# Patient Record
Sex: Male | Born: 1995 | Hispanic: Yes | Marital: Single | Smoking: Never smoker
Health system: Southern US, Community
[De-identification: ages and names within clinical notes are randomized; demographics above are authoritative.]

## PROBLEM LIST (undated history)

## (undated) DIAGNOSIS — J45909 Unspecified asthma, uncomplicated: Secondary | ICD-10-CM

---
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
## 2016-01-31 ENCOUNTER — Emergency Department
Admission: EM | Admit: 2016-01-31 | Discharge: 2016-01-31 | Disposition: A | Discharge status: Home / Self Care | Payer: Medicaid Other | Attending: Emergency Medicine | Admitting: Emergency Medicine

## 2016-01-31 ENCOUNTER — Encounter: Payer: Self-pay | Admitting: *Deleted

## 2016-01-31 DIAGNOSIS — J45909 Unspecified asthma, uncomplicated: Secondary | ICD-10-CM | POA: Diagnosis not present

## 2016-01-31 DIAGNOSIS — L0291 Cutaneous abscess, unspecified: Secondary | ICD-10-CM

## 2016-01-31 DIAGNOSIS — L02416 Cutaneous abscess of left lower limb: Secondary | ICD-10-CM | POA: Diagnosis not present

## 2016-01-31 MED ORDER — IBUPROFEN 600 MG PO TABS
600.0000 mg | ORAL_TABLET | Freq: Once | ORAL | Status: AC
  Administered 2016-01-31: 600 mg via ORAL
  Filled 2016-01-31: qty 1

## 2016-01-31 MED ORDER — TRAMADOL HCL 50 MG PO TABS: 50.0000 mg | ORAL_TABLET | Freq: Four times a day (QID) | ORAL | 0 refills | Status: AC | PRN

## 2016-01-31 MED ORDER — TRAMADOL HCL 50 MG PO TABS
50.0000 mg | ORAL_TABLET | Freq: Once | ORAL | Status: AC
  Administered 2016-01-31: 50 mg via ORAL
  Filled 2016-01-31: qty 1

## 2016-01-31 MED ORDER — IBUPROFEN 600 MG PO TABS: 600.0000 mg | ORAL_TABLET | Freq: Three times a day (TID) | ORAL | 0 refills | Status: DC | PRN

## 2016-01-31 NOTE — ED Triage Notes (Signed)
Pt reports having an abscess on left upper thigh, pt reports abscess drained on its own, pt is here to get area checked, pt denies any other symptoms

## 2016-01-31 NOTE — ED Provider Notes (Signed)
[facility] Emergency Department Provider Note   ____________________________________________   None    (approximate)  I have reviewed the triage vital signs and the nursing notes.   HISTORY  Chief Complaint Abscess    HPI Edward Rosario is a 20 y.o. male patient complaining of pain secondary to an abscess to the medial left thigh. Patient state he was seen by urgent care clinic yesterday given antibiotics. Patient state that coincide not to I&D since it had moderate drainage. Patient rates his pain as a 4/10. No other palliative measures for this complaint.   Past Medical History:  Diagnosis Date  . Asthma     There are no active problems to display for this patient.   History reviewed. No pertinent surgical history.  Prior to Admission medications   Medication Sig Start Date End Date Taking? Authorizing Provider  ibuprofen (ADVIL,MOTRIN) 600 MG tablet Take 1 tablet (600 mg total) by mouth every 8 (eight) hours as needed. 01/31/16   Joni Reiningonald K Alailah Safley, PA-C  traMADol (ULTRAM) 50 MG tablet Take 1 tablet (50 mg total) by mouth every 6 (six) hours as needed. 01/31/16 01/30/17  Joni Reiningonald K Kristene Liberati, PA-C    Allergies Fish allergy  No family history on file.  Social History Social History  Substance Use Topics  . Smoking status: Never Smoker  . Smokeless tobacco: Never Used  . Alcohol use No    Review of Systems Constitutional: No fever/chills Eyes: No visual changes. ENT: No sore throat. Cardiovascular: Denies chest pain. Respiratory: Denies shortness of breath. Gastrointestinal: No abdominal pain.  No nausea, no vomiting.  No diarrhea.  No constipation. Genitourinary: Negative for dysuria. Musculoskeletal: Negative for back pain. Skin: Negative for rash. abscess left thigh Neurological: Negative for headaches, focal weakness or numbness.    ____________________________________________   PHYSICAL EXAM:  VITAL SIGNS: ED Triage Vitals    Enc Vitals Group     BP 01/31/16 0837 126/78     Pulse Rate 01/31/16 0837 60     Resp 01/31/16 0837 18     Temp 01/31/16 0837 98.6 F (37 C)     Temp Source 01/31/16 0837 Oral     SpO2 01/31/16 0837 99 %     Weight 01/31/16 0831 130 lb (59 kg)     Height 01/31/16 0831 5\' 7"  (1.702 m)     Head Circumference --      Peak Flow --      Pain Score 01/31/16 0832 4     Pain Loc --      Pain Edu? --      Excl. in GC? --     Constitutional: Alert and oriented. Well appearing and in no acute distress. Eyes: Conjunctivae are normal. PERRL. EOMI. Head: Atraumatic. Nose: No congestion/rhinnorhea. Mouth/Throat: Mucous membranes are moist.  Oropharynx non-erythematous. Neck: No stridor.  No cervical spine tenderness to palpation. Hematological/Lymphatic/Immunilogical: No cervical lymphadenopathy. Cardiovascular: Normal rate, regular rhythm. Grossly normal heart sounds.  Good peripheral circulation. Respiratory: Normal respiratory effort.  No retractions. Lungs CTAB. Gastrointestinal: Soft and nontender. No distention. No abdominal bruits. No CVA tenderness. Musculoskeletal: No lower extremity tenderness nor edema.  No joint effusions. Neurologic:  Normal speech and language. No gross focal neurologic deficits are appreciated. No gait instability. Skin:  Skin is warm, dry and intact. No rash noted.Erythematous nodule lesion medial left thigh with copious amount of purulent drainage. Psychiatric: Mood and affect are normal. Speech and behavior are normal.  ____________________________________________   LABS (all  labs ordered are listed, but only abnormal results are displayed)  Labs Reviewed - No data to display ____________________________________________  EKG   ____________________________________________  RADIOLOGY   ____________________________________________   PROCEDURES  Procedure(s) performed: None  Procedures  Critical Care performed:  No  ____________________________________________   INITIAL IMPRESSION / ASSESSMENT AND PLAN / ED COURSE  Pertinent labs & imaging results that were available during my care of the patient were reviewed by me and considered in my medical decision making (see chart for details).  Abscess left medial thigh. Patient advised to continue antibiotics. Patient given a prescription for tramadol and ibuprofen.  Clinical Course     ____________________________________________   FINAL CLINICAL IMPRESSION(S) / ED DIAGNOSES  Final diagnoses:  Abscess   Bandage was removed and lesion irrigated. Patient was replenished.   NEW MEDICATIONS STARTED DURING THIS VISIT:  New Prescriptions   IBUPROFEN (ADVIL,MOTRIN) 600 MG TABLET    Take 1 tablet (600 mg total) by mouth every 8 (eight) hours as needed.   TRAMADOL (ULTRAM) 50 MG TABLET    Take 1 tablet (50 mg total) by mouth every 6 (six) hours as needed.     Note:  This document was prepared using Dragon voice recognition software and may include unintentional dictation errors.    Joni Reining, PA-C 01/31/16 1610    Loleta Rose, MD 01/31/16 [phone]

## 2016-01-31 NOTE — ED Notes (Signed)
See triage note  States possible area to left groin  Having increased pain  But area has drained on it's own

## 2016-08-31 ENCOUNTER — Emergency Department
Admission: EM | Admit: 2016-08-31 | Discharge: 2016-08-31 | Disposition: A | Discharge status: Home / Self Care | Payer: Medicaid Other | Attending: Emergency Medicine | Admitting: Emergency Medicine

## 2016-08-31 DIAGNOSIS — J45909 Unspecified asthma, uncomplicated: Secondary | ICD-10-CM | POA: Diagnosis not present

## 2016-08-31 DIAGNOSIS — L0231 Cutaneous abscess of buttock: Secondary | ICD-10-CM | POA: Insufficient documentation

## 2016-08-31 NOTE — ED Provider Notes (Signed)
[facility] Emergency Department Provider Note  ____________________________________________  Time seen: Approximately 3:13 PM  I have reviewed the triage vital signs and the nursing notes.   HISTORY  Chief Complaint Abscess    HPI Edward Rosario is a 21 y.o. male presents emergency department for reevaluation of an abscess. Patient states that over the last 4 days he developed an abscess to the right buttocks. [facility] and was [facility]. Patient was placed on antibiotics. He reports that he started taking the antibiotics this morning. He was concerned as there has not been significant improvement. He wanted to make sure "they did it right." Patient denies any fevers or chills, nausea or vomiting, abdominal pain. Area is continuing to ooze. No packing was applied.   Past Medical History:  Diagnosis Date  . Asthma     There are no active problems to display for this patient.   No past surgical history on file.  Prior to Admission medications   Medication Sig Start Date End Date Taking? Authorizing Provider  ibuprofen (ADVIL,MOTRIN) 600 MG tablet Take 1 tablet (600 mg total) by mouth every 8 (eight) hours as needed. 01/31/16   Joni Reining, PA-C  traMADol (ULTRAM) 50 MG tablet Take 1 tablet (50 mg total) by mouth every 6 (six) hours as needed. 01/31/16 01/30/17  Joni Reining, PA-C    Allergies Fish allergy  No family history on file.  Social History Social History  Substance Use Topics  . Smoking status: Never Smoker  . Smokeless tobacco: Never Used  . Alcohol use No     Review of Systems  Constitutional: No fever/chills Eyes: No visual changes.  Cardiovascular: no chest pain. Respiratory: no cough. No SOB. Gastrointestinal: No abdominal pain.  No nausea, no vomiting.   Musculoskeletal: Negative for musculoskeletal pain. Skin: Negative for rash, abrasions, lacerations, ecchymosis.Positive for  abscess to right buttocks. Neurological: Negative for headaches, focal weakness or numbness. 10-point ROS otherwise negative.  ____________________________________________   PHYSICAL EXAM:  VITAL SIGNS: ED Triage Vitals [08/31/16 1453]  Enc Vitals Group     BP 108/70     Pulse Rate 93     Resp 18     Temp 98 F (36.7 C)     Temp Source Oral     SpO2 98 %     Weight 128 lb (58.1 kg)     Height  (1.702 m)     Head Circumference      Peak Flow      Pain Score 8     Pain Loc      Pain Edu?      Excl. in GC?      Constitutional: Alert and oriented. Well appearing and in no acute distress. Eyes: Conjunctivae are normal. PERRL. EOMI. Head: Atraumatic. Neck: No stridor.    Cardiovascular: Normal rate, regular rhythm. Normal S1 and S2.  Good peripheral circulation. Respiratory: Normal respiratory effort without tachypnea or retractions. Lungs CTAB. Good air entry to the bases with no decreased or absent breath sounds. Musculoskeletal: Full range of motion to all extremities. No gross deformities appreciated. Neurologic:  Normal speech and language. No gross focal neurologic deficits are appreciated.  Skin:  Skin is warm, dry and intact. No rash noted. Abscess that has already been incised and drained is appreciated to the right buttocks. There is surrounding firmness to palpation. No erythema at this time. Abscess is shallow in nature and did not require packing.  Area is continuing to ooze on dressing. Psychiatric: Mood and affect are normal. Speech and behavior are normal. Patient exhibits appropriate insight and judgement.   ____________________________________________   LABS (all labs ordered are listed, but only abnormal results are displayed)  Labs Reviewed - No data to display ____________________________________________  EKG   ____________________________________________  RADIOLOGY   No results  found.  ____________________________________________    PROCEDURES  Procedure(s) performed:    Procedures    Medications - No data to display   ____________________________________________   INITIAL IMPRESSION / ASSESSMENT AND PLAN / ED COURSE  Pertinent labs & imaging results that were available during my care of the patient were reviewed by me and considered in my medical decision making (see chart for details).  Review of the [place] CSRS was performed in accordance of the [facility] prior to dispensing any controlled drugs.     Patient's diagnosis is consistent with abscess to right buttocks. This has already been incised and drained. Area appears to be draining well. Healing well. Patient is advised to continue on antibiotics prescribed. No new medications at this time. No revision to incision and drainage necessary.. She'll follow up primary care as needed. Patient is given ED precautions to return to the ED for any worsening or new symptoms.     ____________________________________________  FINAL CLINICAL IMPRESSION(S) / ED DIAGNOSES  Final diagnoses:  Abscess of buttock, right      NEW MEDICATIONS STARTED DURING THIS VISIT:  New Prescriptions   No medications on file        This chart was dictated using voice recognition software/Dragon. Despite best efforts to proofread, errors can occur which can change the meaning. Any change was purely unintentional.    Racheal Patches, PA-C 08/31/16 1524    Merrily Brittle, MD 08/31/16 [phone]

## 2016-08-31 NOTE — ED Triage Notes (Signed)
Pt reports abscess to right buttock for four days.

## 2017-04-04 ENCOUNTER — Other Ambulatory Visit: Payer: Self-pay | Admitting: Physician Assistant

## 2017-04-04 ENCOUNTER — Ambulatory Visit
Admission: RE | Admit: 2017-04-04 | Discharge: 2017-04-04 | Disposition: A | Discharge status: Home / Self Care | Payer: Worker's Compensation | Source: Ambulatory Visit | Attending: Physician Assistant | Admitting: Physician Assistant

## 2017-04-04 DIAGNOSIS — S4992XA Unspecified injury of left shoulder and upper arm, initial encounter: Secondary | ICD-10-CM | POA: Diagnosis not present

## 2017-04-04 DIAGNOSIS — X58XXXA Exposure to other specified factors, initial encounter: Secondary | ICD-10-CM | POA: Diagnosis not present

## 2018-04-07 ENCOUNTER — Emergency Department
Admission: EM | Admit: 2018-04-07 | Discharge: 2018-04-07 | Disposition: A | Discharge status: Home / Self Care | Payer: No Typology Code available for payment source | Attending: Student in an Organized Health Care Education/Training Program | Admitting: Student in an Organized Health Care Education/Training Program

## 2018-04-07 ENCOUNTER — Other Ambulatory Visit: Payer: Self-pay

## 2018-04-07 ENCOUNTER — Encounter: Payer: Self-pay | Admitting: Emergency Medicine

## 2018-04-07 ENCOUNTER — Emergency Department: Payer: No Typology Code available for payment source

## 2018-04-07 DIAGNOSIS — Y939 Activity, unspecified: Secondary | ICD-10-CM | POA: Diagnosis not present

## 2018-04-07 DIAGNOSIS — S161XXA Strain of muscle, fascia and tendon at neck level, initial encounter: Secondary | ICD-10-CM

## 2018-04-07 DIAGNOSIS — Y929 Unspecified place or not applicable: Secondary | ICD-10-CM | POA: Diagnosis not present

## 2018-04-07 DIAGNOSIS — Y999 Unspecified external cause status: Secondary | ICD-10-CM | POA: Insufficient documentation

## 2018-04-07 DIAGNOSIS — J45909 Unspecified asthma, uncomplicated: Secondary | ICD-10-CM | POA: Diagnosis not present

## 2018-04-07 DIAGNOSIS — S39012A Strain of muscle, fascia and tendon of lower back, initial encounter: Secondary | ICD-10-CM | POA: Insufficient documentation

## 2018-04-07 DIAGNOSIS — S199XXA Unspecified injury of neck, initial encounter: Secondary | ICD-10-CM | POA: Diagnosis present

## 2018-04-07 MED ORDER — METHOCARBAMOL 500 MG PO TABS: 500.0000 mg | ORAL_TABLET | Freq: Four times a day (QID) | ORAL | 0 refills | Status: DC

## 2018-04-07 MED ORDER — MELOXICAM 15 MG PO TABS: 15.0000 mg | ORAL_TABLET | Freq: Every day | ORAL | 0 refills | Status: DC
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
## 2018-04-07 NOTE — ED Notes (Signed)
Patient declined discharge vital signs. 
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
## 2018-07-10 ENCOUNTER — Other Ambulatory Visit: Payer: Self-pay

## 2018-07-10 ENCOUNTER — Encounter: Payer: Self-pay | Admitting: Emergency Medicine

## 2018-07-10 ENCOUNTER — Emergency Department
Admission: EM | Admit: 2018-07-10 | Discharge: 2018-07-10 | Disposition: A | Discharge status: Home / Self Care | Payer: Medicaid Other | Attending: Emergency Medicine | Admitting: Emergency Medicine

## 2018-07-10 DIAGNOSIS — R109 Unspecified abdominal pain: Secondary | ICD-10-CM

## 2018-07-10 DIAGNOSIS — R197 Diarrhea, unspecified: Secondary | ICD-10-CM

## 2018-07-10 DIAGNOSIS — Z23 Encounter for immunization: Secondary | ICD-10-CM | POA: Insufficient documentation

## 2018-07-10 DIAGNOSIS — Z79899 Other long term (current) drug therapy: Secondary | ICD-10-CM | POA: Insufficient documentation

## 2018-07-10 DIAGNOSIS — J45909 Unspecified asthma, uncomplicated: Secondary | ICD-10-CM | POA: Insufficient documentation

## 2018-07-10 DIAGNOSIS — K3580 Unspecified acute appendicitis: Principal | ICD-10-CM | POA: Insufficient documentation

## 2018-07-10 DIAGNOSIS — R1013 Epigastric pain: Secondary | ICD-10-CM | POA: Insufficient documentation

## 2018-07-10 LAB — URINALYSIS, COMPLETE (UACMP) WITH MICROSCOPIC
Bacteria, UA: NONE SEEN
Bilirubin Urine: NEGATIVE
GLUCOSE, UA: NEGATIVE mg/dL
Hgb urine dipstick: NEGATIVE
KETONES UR: NEGATIVE mg/dL
Nitrite: NEGATIVE
PH: 5 (ref 5.0–8.0)
Protein, ur: NEGATIVE mg/dL
SPECIFIC GRAVITY, URINE: 1.009 (ref 1.005–1.030)

## 2018-07-10 LAB — LIPASE, BLOOD: Lipase: 23 U/L (ref 11–51)

## 2018-07-10 LAB — COMPREHENSIVE METABOLIC PANEL
ALK PHOS: 69 U/L (ref 38–126)
ALT: 13 U/L (ref 0–44)
AST: 20 U/L (ref 15–41)
Albumin: 5.1 g/dL — ABNORMAL HIGH (ref 3.5–5.0)
Anion gap: 9 (ref 5–15)
BUN: 10 mg/dL (ref 6–20)
CALCIUM: 9.6 mg/dL (ref 8.9–10.3)
CO2: 26 mmol/L (ref 22–32)
CREATININE: 0.77 mg/dL (ref 0.61–1.24)
Chloride: 102 mmol/L (ref 98–111)
Glucose, Bld: 106 mg/dL — ABNORMAL HIGH (ref 70–99)
Potassium: 3.7 mmol/L (ref 3.5–5.1)
Sodium: 137 mmol/L (ref 135–145)
Total Bilirubin: 1.8 mg/dL — ABNORMAL HIGH (ref 0.3–1.2)
Total Protein: 8.3 g/dL — ABNORMAL HIGH (ref 6.5–8.1)

## 2018-07-10 LAB — CBC
HCT: 45.9 % (ref 39.0–52.0)
HEMATOCRIT: 43 % (ref 39.0–52.0)
HEMOGLOBIN: 15.3 g/dL (ref 13.0–17.0)
Hemoglobin: 16.2 g/dL (ref 13.0–17.0)
MCH: 30.7 pg (ref 26.0–34.0)
MCH: 30.9 pg (ref 26.0–34.0)
MCHC: 35.3 g/dL (ref 30.0–36.0)
MCHC: 35.6 g/dL (ref 30.0–36.0)
MCV: 87.1 fL (ref 80.0–100.0)
MCV: 86.9 fL (ref 80.0–100.0)
NRBC: 0 % (ref 0.0–0.2)
PLATELETS: 182 10*3/uL (ref 150–400)
Platelets: 178 10*3/uL (ref 150–400)
RBC: 5.27 MIL/uL (ref 4.22–5.81)
RBC: 4.95 MIL/uL (ref 4.22–5.81)
RDW: 12.2 % (ref 11.5–15.5)
RDW: 12.3 % (ref 11.5–15.5)
WBC: 15.3 10*3/uL — AB (ref 4.0–10.5)
WBC: 18.8 10*3/uL — ABNORMAL HIGH (ref 4.0–10.5)
nRBC: 0 % (ref 0.0–0.2)

## 2018-07-10 MED ORDER — ONDANSETRON 4 MG PO TBDP: 4.0000 mg | ORAL_TABLET | Freq: Three times a day (TID) | ORAL | 0 refills | Status: DC | PRN

## 2018-07-10 MED ORDER — SODIUM CHLORIDE 0.9 % IV BOLUS
1000.0000 mL | Freq: Once | INTRAVENOUS | Status: AC
  Administered 2018-07-10: 1000 mL via INTRAVENOUS

## 2018-07-10 MED ORDER — KETOROLAC TROMETHAMINE 30 MG/ML IJ SOLN
15.0000 mg | Freq: Once | INTRAMUSCULAR | Status: AC
  Administered 2018-07-10: 15 mg via INTRAVENOUS
  Filled 2018-07-10: qty 1

## 2018-07-10 MED ORDER — ONDANSETRON HCL 4 MG/2ML IJ SOLN
4.0000 mg | Freq: Once | INTRAMUSCULAR | Status: AC
  Administered 2018-07-10: 4 mg via INTRAVENOUS
  Filled 2018-07-10: qty 2

## 2018-07-10 MED ORDER — SODIUM CHLORIDE 0.9% FLUSH: 3.0000 mL | Freq: Once | INTRAVENOUS | Status: DC

## 2018-07-10 NOTE — ED Triage Notes (Signed)
Pt states was discharged today with generalized abd pain, nausea, vomiting, and diarrhea. Pt states he was given prescriptions to get filled but did not. Pt states "I can't take it".

## 2018-07-10 NOTE — ED Provider Notes (Signed)
[facility] Emergency Department Provider Note  ____________________________________________  Time seen: Approximately 5:27 PM  I have reviewed the triage vital signs and the nursing notes.   HISTORY  Chief Complaint Abdominal Pain   HPI Edward Rosario is a 23 y.o. male with a history of asthma who presents for evaluation of abdominal pain and diarrhea.  Patient reports that his symptoms started today.  3 episodes of diarrhea.  Has had nausea but no vomiting.  He is complaining of epigastric dull-like pain which has been present since this morning.  No fever but has had chills.  No cough, congestion, runny nose, shortness of breath or chest pain.  No history of C. difficile or recent antibiotic use.  Past Medical History:  Diagnosis Date  . Asthma     Prior to Admission medications   Medication Sig Start Date End Date Taking? Authorizing Provider  ibuprofen (ADVIL,MOTRIN) 600 MG tablet Take 1 tablet (600 mg total) by mouth every 8 (eight) hours as needed. 01/31/16   Joni ReiningSmith, Ronald K, PA-C  meloxicam (MOBIC) 15 MG tablet Take 1 tablet (15 mg total) by mouth daily. 04/07/18   Cuthriell, Delorise RoyalsJonathan D, PA-C  methocarbamol (ROBAXIN) 500 MG tablet Take 1 tablet (500 mg total) by mouth 4 (four) times daily. 04/07/18   Cuthriell, Delorise RoyalsJonathan D, PA-C  ondansetron (ZOFRAN ODT) 4 MG disintegrating tablet Take 1 tablet (4 mg total) by mouth every 8 (eight) hours as needed. 07/10/18   Nita SickleVeronese, , MD    Allergies Fish allergy  No family history on file.  Social History Social History   Tobacco Use  . Smoking status: Never Smoker  . Smokeless tobacco: Never Used  Substance Use Topics  . Alcohol use: No  . Drug use: Not on file    Review of Systems  Constitutional: Negative for fever. Eyes: Negative for visual changes. ENT: Negative for sore throat. Neck: No neck pain  Cardiovascular: Negative for chest pain. Respiratory: Negative for shortness of  breath. Gastrointestinal: + abdominal pain, nausea, and diarrhea. No vomiting Genitourinary: Negative for dysuria. Musculoskeletal: Negative for back pain. Skin: Negative for rash. Neurological: Negative for headaches, weakness or numbness. Psych: No SI or HI  ____________________________________________   PHYSICAL EXAM:  VITAL SIGNS: ED Triage Vitals  Enc Vitals Group     BP 07/10/18 1421 132/80     Pulse Rate 07/10/18 1421 100     Resp 07/10/18 1421 18     Temp 07/10/18 1421 98.8 F (37.1 C)     Temp Source 07/10/18 1421 Oral     SpO2 07/10/18 1421 97 %     Weight 07/10/18 1422 130 lb (59 kg)     Height 07/10/18 1422 5\' 8"  (1.727 m)     Head Circumference --      Peak Flow --      Pain Score 07/10/18 1422 10     Pain Loc --      Pain Edu? --      Excl. in GC? --     Constitutional: Alert and oriented. Well appearing and in no apparent distress. HEENT:      Head: Normocephalic and atraumatic.         Eyes: Conjunctivae are normal. Sclera is non-icteric.       Mouth/Throat: Mucous membranes are moist.       Neck: Supple with no signs of meningismus. Cardiovascular: Regular rate and rhythm. No murmurs, gallops, or rubs. 2+ symmetrical distal pulses are present in  all extremities. No JVD. Respiratory: Normal respiratory effort. Lungs are clear to auscultation bilaterally. No wheezes, crackles, or rhonchi.  Gastrointestinal: Soft, non tender, and non distended with positive bowel sounds. No rebound or guarding. Musculoskeletal: Nontender with normal range of motion in all extremities. No edema, cyanosis, or erythema of extremities. Neurologic: Normal speech and language. Face is symmetric. Moving all extremities. No gross focal neurologic deficits are appreciated. Skin: Skin is warm, dry and intact. No rash noted. Psychiatric: Mood and affect are normal. Speech and behavior are normal.  ____________________________________________   LABS (all labs ordered are listed,  but only abnormal results are displayed)  Labs Reviewed  COMPREHENSIVE METABOLIC PANEL - Abnormal; Notable for the following components:      Result Value   Glucose, Bld 106 (*)    Total Protein 8.3 (*)    Albumin 5.1 (*)    Total Bilirubin 1.8 (*)    All other components within normal limits  CBC - Abnormal; Notable for the following components:   WBC 15.3 (*)    All other components within normal limits  URINALYSIS, COMPLETE (UACMP) WITH MICROSCOPIC - Abnormal; Notable for the following components:   Color, Urine YELLOW (*)    APPearance CLEAR (*)    Leukocytes, UA TRACE (*)    All other components within normal limits  LIPASE, BLOOD   ____________________________________________  EKG  none  ____________________________________________  RADIOLOGY  none  ____________________________________________   PROCEDURES  Procedure(s) performed: None Procedures Critical Care performed:  None ____________________________________________   INITIAL IMPRESSION / ASSESSMENT AND PLAN / ED COURSE  23 y.o. male with a history of asthma who presents for evaluation of abdominal pain and diarrhea.  Patient is well-appearing and in no distress, normal vital signs, abdomen is soft with no tenderness throughout.  Labs showing leukocytosis with white count of 15 consistent with infectious diarrhea versus viral gastroenteritis.  With no abdominal tenderness low suspicion for appendicitis.  UA, CMP and lipase are within normal limits.  Will give IV Toradol, fluids and Zofran and reassess.    _________________________ 7:07 PM on 07/10/2018 -----------------------------------------  Patient feels markedly improved.  No further episodes of diarrhea in the emergency room.  No vomiting, tolerating p.o.  Abdomen remains soft with no tenderness.  Will discharge home with Zofran for nausea, bland diet, and increase oral hydration.  Discussed return precautions for any abdominal pain especially right  lower quadrant or fever.  Discussed close follow-up with primary care doctor.   As part of my medical decision making, I reviewed the following data within the electronic MEDICAL RECORD NUMBER Nursing notes reviewed and incorporated, Labs reviewed , Old chart reviewed, Notes from prior ED visits and [place] Controlled Substance Database    Pertinent labs & imaging results that were available during my care of the patient were reviewed by me and considered in my medical decision making (see chart for details).    ____________________________________________   FINAL CLINICAL IMPRESSION(S) / ED DIAGNOSES  Final diagnoses:  Abdominal pain, unspecified abdominal location  Diarrhea of presumed infectious origin      NEW MEDICATIONS STARTED DURING THIS VISIT:  ED Discharge Orders         Ordered    ondansetron (ZOFRAN ODT) 4 MG disintegrating tablet  Every 8 hours PRN     07/10/18 1907           Note:  This document was prepared using Dragon voice recognition software and may include unintentional dictation errors.    Don PerkingVeronese,  [place], MD 07/10/18 Windell Moment

## 2018-07-10 NOTE — Discharge Instructions (Addendum)

## 2018-07-10 NOTE — ED Triage Notes (Signed)
Awoke with abd pain , and loose stool this AM. Denies urinary symptoms

## 2018-07-11 ENCOUNTER — Observation Stay: Payer: Self-pay | Admitting: Anesthesiology

## 2018-07-11 ENCOUNTER — Observation Stay
Admission: EM | Admit: 2018-07-11 | Discharge: 2018-07-12 | Disposition: A | Discharge status: Home / Self Care | Payer: Self-pay | Attending: Surgery | Admitting: Surgery

## 2018-07-11 ENCOUNTER — Encounter
Admission: EM | Disposition: A | Discharge status: Home / Self Care | Payer: Self-pay | Source: Home / Self Care | Attending: Emergency Medicine

## 2018-07-11 ENCOUNTER — Emergency Department: Payer: Self-pay

## 2018-07-11 DIAGNOSIS — R509 Fever, unspecified: Secondary | ICD-10-CM

## 2018-07-11 DIAGNOSIS — R1031 Right lower quadrant pain: Secondary | ICD-10-CM

## 2018-07-11 DIAGNOSIS — K3580 Unspecified acute appendicitis: Secondary | ICD-10-CM | POA: Diagnosis present

## 2018-07-11 HISTORY — PX: LAPAROSCOPIC APPENDECTOMY: SHX408

## 2018-07-11 LAB — COMPREHENSIVE METABOLIC PANEL
ALT: 12 U/L (ref 0–44)
AST: 21 U/L (ref 15–41)
Albumin: 5 g/dL (ref 3.5–5.0)
Alkaline Phosphatase: 64 U/L (ref 38–126)
Anion gap: 9 (ref 5–15)
BILIRUBIN TOTAL: 2 mg/dL — AB (ref 0.3–1.2)
BUN: 9 mg/dL (ref 6–20)
CHLORIDE: 103 mmol/L (ref 98–111)
CO2: 25 mmol/L (ref 22–32)
Calcium: 9.1 mg/dL (ref 8.9–10.3)
Creatinine, Ser: 0.79 mg/dL (ref 0.61–1.24)
GFR calc Af Amer: >60 mL/min (ref >60)
GLUCOSE: 142 mg/dL — AB (ref 70–99)
POTASSIUM: 3.3 mmol/L — AB (ref 3.5–5.1)
Sodium: 137 mmol/L (ref 135–145)
Total Protein: 7.8 g/dL (ref 6.5–8.1)

## 2018-07-11 LAB — URINALYSIS, COMPLETE (UACMP) WITH MICROSCOPIC
BACTERIA UA: NONE SEEN
BILIRUBIN URINE: NEGATIVE
Glucose, UA: NEGATIVE mg/dL
Ketones, ur: NEGATIVE mg/dL
Nitrite: NEGATIVE
Protein, ur: NEGATIVE mg/dL
Specific Gravity, Urine: 1.011 (ref 1.005–1.030)
pH: 5 (ref 5.0–8.0)

## 2018-07-11 LAB — LACTIC ACID, PLASMA
Lactic Acid, Venous: 2.9 mmol/L (ref 0.5–1.9)
Lactic Acid, Venous: 0.8 mmol/L (ref 0.5–1.9)

## 2018-07-11 LAB — INFLUENZA PANEL BY PCR (TYPE A & B)
INFLAPCR: NEGATIVE
Influenza B By PCR: NEGATIVE

## 2018-07-11 LAB — LIPASE, BLOOD: LIPASE: 23 U/L (ref 11–51)

## 2018-07-11 SURGERY — APPENDECTOMY, LAPAROSCOPIC
Anesthesia: General

## 2018-07-11 MED ORDER — FENTANYL CITRATE (PF) 100 MCG/2ML IJ SOLN
INTRAMUSCULAR | Status: AC
  Filled 2018-07-11: qty 2

## 2018-07-11 MED ORDER — POLYETHYLENE GLYCOL 3350 17 G PO PACK: 17.0000 g | PACK | Freq: Every day | ORAL | Status: DC | PRN

## 2018-07-11 MED ORDER — LIDOCAINE HCL (CARDIAC) PF 100 MG/5ML IV SOSY
PREFILLED_SYRINGE | INTRAVENOUS | Status: DC | PRN
  Administered 2018-07-11: 60 mg via INTRAVENOUS

## 2018-07-11 MED ORDER — DEXMEDETOMIDINE HCL 200 MCG/2ML IV SOLN
INTRAVENOUS | Status: DC | PRN
  Administered 2018-07-11 (×2): 10 ug via INTRAVENOUS

## 2018-07-11 MED ORDER — LACTATED RINGERS IV SOLN
Freq: Once | INTRAVENOUS | Status: AC
  Administered 2018-07-11: 09:00:00 via INTRAVENOUS

## 2018-07-11 MED ORDER — ACETAMINOPHEN 500 MG PO TABS
1000.0000 mg | ORAL_TABLET | Freq: Once | ORAL | Status: AC
  Administered 2018-07-11: 1000 mg via ORAL
  Filled 2018-07-11: qty 2

## 2018-07-11 MED ORDER — INFLUENZA VAC SPLIT QUAD 0.5 ML IM SUSY
0.5000 mL | PREFILLED_SYRINGE | INTRAMUSCULAR | Status: AC
  Administered 2018-07-12: 0.5 mL via INTRAMUSCULAR
  Filled 2018-07-11: qty 0.5

## 2018-07-11 MED ORDER — PROPOFOL 10 MG/ML IV BOLUS
INTRAVENOUS | Status: DC | PRN
  Administered 2018-07-11: 150 mg via INTRAVENOUS

## 2018-07-11 MED ORDER — PIPERACILLIN-TAZOBACTAM 3.375 G IVPB 30 MIN
3.3750 g | Freq: Once | INTRAVENOUS | Status: AC
  Administered 2018-07-11: 3.375 g via INTRAVENOUS
  Filled 2018-07-11: qty 50

## 2018-07-11 MED ORDER — PIPERACILLIN-TAZOBACTAM 3.375 G IVPB
3.3750 g | Freq: Three times a day (TID) | INTRAVENOUS | Status: DC
  Administered 2018-07-11 – 2018-07-12 (×3): 3.375 g via INTRAVENOUS
  Filled 2018-07-11 (×3): qty 50

## 2018-07-11 MED ORDER — ONDANSETRON 4 MG PO TBDP: 4.0000 mg | ORAL_TABLET | Freq: Four times a day (QID) | ORAL | Status: DC | PRN

## 2018-07-11 MED ORDER — ONDANSETRON HCL 4 MG/2ML IJ SOLN
4.0000 mg | Freq: Four times a day (QID) | INTRAMUSCULAR | Status: DC | PRN
  Administered 2018-07-11: 4 mg via INTRAVENOUS

## 2018-07-11 MED ORDER — SODIUM CHLORIDE 0.9 % IV BOLUS
1000.0000 mL | Freq: Once | INTRAVENOUS | Status: AC
  Administered 2018-07-11: 1000 mL via INTRAVENOUS

## 2018-07-11 MED ORDER — SODIUM CHLORIDE 0.9 % IV BOLUS: 1000.0000 mL | Freq: Once | INTRAVENOUS | Status: DC

## 2018-07-11 MED ORDER — HYDROMORPHONE HCL 1 MG/ML IJ SOLN: 0.5000 mg | INTRAMUSCULAR | Status: DC | PRN

## 2018-07-11 MED ORDER — FENTANYL CITRATE (PF) 100 MCG/2ML IJ SOLN: 25.0000 ug | INTRAMUSCULAR | Status: DC | PRN

## 2018-07-11 MED ORDER — BUPIVACAINE-EPINEPHRINE (PF) 0.5% -1:200000 IJ SOLN
INTRAMUSCULAR | Status: DC | PRN
  Administered 2018-07-11: 30 mL

## 2018-07-11 MED ORDER — ROCURONIUM BROMIDE 100 MG/10ML IV SOLN
INTRAVENOUS | Status: DC | PRN
  Administered 2018-07-11: 30 mg via INTRAVENOUS

## 2018-07-11 MED ORDER — ONDANSETRON HCL 4 MG/2ML IJ SOLN: 4.0000 mg | Freq: Once | INTRAMUSCULAR | Status: DC | PRN

## 2018-07-11 MED ORDER — KETOROLAC TROMETHAMINE 30 MG/ML IJ SOLN
INTRAMUSCULAR | Status: DC | PRN
  Administered 2018-07-11: 30 mg via INTRAVENOUS

## 2018-07-11 MED ORDER — DEXAMETHASONE SODIUM PHOSPHATE 10 MG/ML IJ SOLN
INTRAMUSCULAR | Status: DC | PRN
  Administered 2018-07-11: 5 mg via INTRAVENOUS

## 2018-07-11 MED ORDER — SUGAMMADEX SODIUM 200 MG/2ML IV SOLN
INTRAVENOUS | Status: DC | PRN
  Administered 2018-07-11: 200 mg via INTRAVENOUS

## 2018-07-11 MED ORDER — OXYCODONE HCL 5 MG PO TABS: 5.0000 mg | ORAL_TABLET | ORAL | Status: DC | PRN

## 2018-07-11 MED ORDER — LACTATED RINGERS IV SOLN
125.0000 mL/h | INTRAVENOUS | Status: DC
  Administered 2018-07-11 – 2018-07-12 (×3): 125 mL/h via INTRAVENOUS

## 2018-07-11 MED ORDER — FENTANYL CITRATE (PF) 100 MCG/2ML IJ SOLN
INTRAMUSCULAR | Status: DC | PRN
  Administered 2018-07-11: 100 ug via INTRAVENOUS

## 2018-07-11 MED ORDER — PANTOPRAZOLE SODIUM 40 MG IV SOLR
40.0000 mg | Freq: Every day | INTRAVENOUS | Status: DC
  Administered 2018-07-11: 40 mg via INTRAVENOUS
  Filled 2018-07-11: qty 40

## 2018-07-11 MED ORDER — BUPIVACAINE-EPINEPHRINE (PF) 0.5% -1:200000 IJ SOLN
INTRAMUSCULAR | Status: AC
  Filled 2018-07-11: qty 30

## 2018-07-11 MED ORDER — ONDANSETRON HCL 4 MG/2ML IJ SOLN
4.0000 mg | Freq: Once | INTRAMUSCULAR | Status: AC
  Administered 2018-07-11: 4 mg via INTRAVENOUS
  Filled 2018-07-11: qty 2

## 2018-07-11 MED ORDER — SUCCINYLCHOLINE CHLORIDE 20 MG/ML IJ SOLN
INTRAMUSCULAR | Status: DC | PRN
  Administered 2018-07-11: 60 mg via INTRAVENOUS

## 2018-07-11 MED ORDER — DEXMEDETOMIDINE HCL IN NACL 200 MCG/50ML IV SOLN
INTRAVENOUS | Status: AC
  Filled 2018-07-11: qty 50

## 2018-07-11 MED ORDER — IOPAMIDOL (ISOVUE-300) INJECTION 61%
30.0000 mL | Freq: Once | INTRAVENOUS | Status: AC | PRN
  Administered 2018-07-11: 30 mL via ORAL

## 2018-07-11 MED ORDER — FENTANYL CITRATE (PF) 100 MCG/2ML IJ SOLN
50.0000 ug | Freq: Once | INTRAMUSCULAR | Status: AC
  Administered 2018-07-11: 50 ug via INTRAVENOUS
  Filled 2018-07-11: qty 2

## 2018-07-11 MED ORDER — MIDAZOLAM HCL 2 MG/2ML IJ SOLN
INTRAMUSCULAR | Status: AC
  Filled 2018-07-11: qty 2

## 2018-07-11 MED ORDER — SODIUM CHLORIDE 0.9 % IV SOLN
INTRAVENOUS | Status: DC | PRN
  Administered 2018-07-11: 250 mL via INTRAVENOUS

## 2018-07-11 MED ORDER — PHENYLEPHRINE HCL 10 MG/ML IJ SOLN
INTRAMUSCULAR | Status: DC | PRN
  Administered 2018-07-11 (×2): 100 ug via INTRAVENOUS

## 2018-07-11 MED ORDER — MIDAZOLAM HCL 2 MG/2ML IJ SOLN
INTRAMUSCULAR | Status: DC | PRN
  Administered 2018-07-11: 2 mg via INTRAVENOUS

## 2018-07-11 MED ORDER — ACETAMINOPHEN 500 MG PO TABS: 1000.0000 mg | ORAL_TABLET | Freq: Four times a day (QID) | ORAL | Status: DC | PRN

## 2018-07-11 MED ORDER — KETOROLAC TROMETHAMINE 30 MG/ML IJ SOLN
INTRAMUSCULAR | Status: AC
  Filled 2018-07-11: qty 1

## 2018-07-11 MED ORDER — SUGAMMADEX SODIUM 200 MG/2ML IV SOLN
INTRAVENOUS | Status: AC
  Filled 2018-07-11: qty 2

## 2018-07-11 MED ORDER — KETOROLAC TROMETHAMINE 30 MG/ML IJ SOLN
30.0000 mg | Freq: Four times a day (QID) | INTRAMUSCULAR | Status: DC
  Administered 2018-07-11 – 2018-07-12 (×4): 30 mg via INTRAVENOUS
  Filled 2018-07-11 (×3): qty 1

## 2018-07-11 MED ORDER — PROPOFOL 10 MG/ML IV BOLUS
INTRAVENOUS | Status: AC
  Filled 2018-07-11: qty 40

## 2018-07-11 MED ORDER — ENOXAPARIN SODIUM 40 MG/0.4ML ~~LOC~~ SOLN
40.0000 mg | SUBCUTANEOUS | Status: DC
  Administered 2018-07-12: 40 mg via SUBCUTANEOUS
  Filled 2018-07-11: qty 0.4

## 2018-07-11 MED ORDER — IOPAMIDOL (ISOVUE-300) INJECTION 61%
100.0000 mL | Freq: Once | INTRAVENOUS | Status: AC | PRN
  Administered 2018-07-11: 100 mL via INTRAVENOUS

## 2018-07-11 MED ORDER — SODIUM CHLORIDE 0.9 % IV SOLN
INTRAVENOUS | Status: DC | PRN
  Administered 2018-07-11: 07:00:00 via INTRAVENOUS

## 2018-07-11 SURGICAL SUPPLY — 38 items
CANISTER SUCT 1200ML W/VALVE (MISCELLANEOUS) ×3 IMPLANT
CHLORAPREP W/TINT 26ML (MISCELLANEOUS) ×3 IMPLANT
COVER WAND RF STERILE (DRAPES) IMPLANT
CUTTER FLEX LINEAR 45M (STAPLE) ×3 IMPLANT
DERMABOND ADVANCED (GAUZE/BANDAGES/DRESSINGS) ×2
DERMABOND ADVANCED .7 DNX12 (GAUZE/BANDAGES/DRESSINGS) ×1 IMPLANT
ELECT CAUTERY BLADE 6.4 (BLADE) ×3 IMPLANT
ELECT REM PT RETURN 9FT ADLT (ELECTROSURGICAL) ×3
ELECTRODE REM PT RTRN 9FT ADLT (ELECTROSURGICAL) ×1 IMPLANT
GLOVE SURG SYN 7.0 (GLOVE) ×6 IMPLANT
GLOVE SURG SYN 7.5  E (GLOVE) ×4
GLOVE SURG SYN 7.5 E (GLOVE) ×2 IMPLANT
GOWN STRL REUS W/ TWL LRG LVL3 (GOWN DISPOSABLE) ×2 IMPLANT
GOWN STRL REUS W/TWL LRG LVL3 (GOWN DISPOSABLE) ×4
IRRIGATION STRYKERFLOW (MISCELLANEOUS) ×1 IMPLANT
IRRIGATOR STRYKERFLOW (MISCELLANEOUS) ×3
IV NS 1000ML (IV SOLUTION) ×2
IV NS 1000ML BAXH (IV SOLUTION) ×1 IMPLANT
KIT TURNOVER KIT A (KITS) ×3 IMPLANT
LABEL OR SOLS (LABEL) IMPLANT
LIGASURE LAP MARYLAND 5MM 37CM (ELECTROSURGICAL) ×3 IMPLANT
NEEDLE HYPO 22GX1.5 SAFETY (NEEDLE) ×3 IMPLANT
NS IRRIG 500ML POUR BTL (IV SOLUTION) ×3 IMPLANT
PACK LAP CHOLECYSTECTOMY (MISCELLANEOUS) ×3 IMPLANT
PENCIL ELECTRO HAND CTR (MISCELLANEOUS) ×3 IMPLANT
POUCH SPECIMEN RETRIEVAL 10MM (ENDOMECHANICALS) ×3 IMPLANT
RELOAD 45 VASCULAR/THIN (ENDOMECHANICALS) IMPLANT
RELOAD STAPLE TA45 3.5 REG BLU (ENDOMECHANICALS) ×3 IMPLANT
SCISSORS METZENBAUM CVD 33 (INSTRUMENTS) IMPLANT
SET TUBE SMOKE EVAC HIGH FLOW (TUBING) ×3 IMPLANT
SLEEVE ADV FIXATION 5X100MM (TROCAR) ×6 IMPLANT
SUT MNCRL 4-0 (SUTURE) ×2
SUT MNCRL 4-0 27XMFL (SUTURE) ×1
SUT VICRYL 0 AB UR-6 (SUTURE) ×3 IMPLANT
SUTURE MNCRL 4-0 27XMF (SUTURE) ×1 IMPLANT
TRAY FOLEY MTR SLVR 16FR STAT (SET/KITS/TRAYS/PACK) ×3 IMPLANT
TROCAR BALLN GELPORT 12X130M (ENDOMECHANICALS) ×3 IMPLANT
TROCAR Z-THREAD OPTICAL 5X100M (TROCAR) ×3 IMPLANT

## 2018-07-11 NOTE — Op Note (Signed)
  Procedure Date:  07/11/2018  Pre-operative Diagnosis:  Acute appendicitis  Post-operative Diagnosis:  Acute appendicitis  Procedure:  Laparoscopic appendectomy  Surgeon:  Howie Ill, MD  Anesthesia:  General endotracheal  Estimated Blood Loss:  10 ml  Specimens:  appendix  Complications:  None  Indications for Procedure:  This is a 23 y.o. male who presents with abdominal pain and workup revealing acute appendicitis.  The options of surgery versus observation were reviewed with the patient and/or family. The risks of bleeding, infection, recurrence of symptoms, negative laparoscopy, potential for an open procedure, bowel injury, abscess or infection, were all discussed with the patient and he was willing to proceed.  Description of Procedure: The patient was correctly identified in the preoperative area and brought into the operating room.  The patient was placed supine with VTE prophylaxis in place.  Appropriate time-outs were performed.  Anesthesia was induced and the patient was intubated.  Foley catheter was placed.  Appropriate antibiotics were infused.  The abdomen was prepped and draped in a sterile fashion. An infraumbilical incision was made. A cutdown technique was used to enter the abdominal cavity without injury, and a Hasson trocar was inserted.  Pneumoperitoneum was obtained with appropriate opening pressures.  Two 5-mm ports were placed in the suprapubic and left lateral positions under direct visualization.  The right lower quadrant was inspected and the appendix was identified and found to be acutely inflamed with seropurulent fluid in the pelvis.  The appendix was carefully dissected.  The mesoappendix was divided using the LigaSure.  The base of the appendix was dissected out and divided with a standard load Endo GIA.  The appendix was placed in an Endocatch bag.  The right lower quadrant was then inspected again revealing an intact staple line, no bleeding, and no  bowel injury.  The area was thoroughly irrigated. A 19 Fr. Blake drain was placed via left lower quadrant port incision and placed along right gutter and pelvis.  The 5 mm ports were removed under direct visualization and the Hasson trocar was removed.  The Endocatch bag was brought out through the umbilical incision.  The fascial opening was closed using 0 vicryl suture.  3-0 Nylon was used to secure the drain in place.  Local anesthetic was infused in all incisions.  The umbilical incision was closed with 3-0 Vicryl and 4-0 Monocryl, and the low abdominal was closed with 4-0 Monocryl.  The wounds were cleaned and sealed with DermaBond.  Foley catheter was removed and the patient was emerged from anesthesia and extubated and brought to the recovery room for further management.  The patient tolerated the procedure well and all counts were correct at the end of the case.   Howie Ill, MD

## 2018-07-11 NOTE — ED Notes (Signed)
MD Sung aware of lactic acid result.  

## 2018-07-11 NOTE — ED Provider Notes (Signed)
[facility] Emergency Department Provider Note   ____________________________________________   First MD Initiated Contact with Patient 07/11/18 0235     (approximate)  I have reviewed the triage vital signs and the nursing notes.   HISTORY  Chief Complaint Abdominal Pain    HPI Edward Rosario is a 23 y.o. male who returns to the ED from home with persistent abdominal pain.  Patient was seen in the evening for abdominal pain and diarrhea x3 days.  Also nausea without vomiting.  Work-up including lab work and urine was reassuring and patient was discharged home with prescription for Zofran.  He was unable to get to the pharmacy and returns for worsening abdominal pain, now generalized.  Also now with low-grade fever.  Denies chest pain, shortness of breath, vomiting.  Does complain of dysuria.  Denies recent travel, trauma or hormone use.    Past Medical History:  Diagnosis Date  . Asthma     There are no active problems to display for this patient.   No past surgical history on file.  Prior to Admission medications   Medication Sig Start Date End Date Taking? Authorizing Provider  ibuprofen (ADVIL,MOTRIN) 600 MG tablet Take 1 tablet (600 mg total) by mouth every 8 (eight) hours as needed. Patient not taking: Reported on 07/11/2018 01/31/16   Joni Reining, PA-C  meloxicam (MOBIC) 15 MG tablet Take 1 tablet (15 mg total) by mouth daily. Patient not taking: Reported on 07/11/2018 04/07/18   Cuthriell, Delorise Royals, PA-C  methocarbamol (ROBAXIN) 500 MG tablet Take 1 tablet (500 mg total) by mouth 4 (four) times daily. Patient not taking: Reported on 07/11/2018 04/07/18   Cuthriell, Delorise Royals, PA-C  ondansetron (ZOFRAN ODT) 4 MG disintegrating tablet Take 1 tablet (4 mg total) by mouth every 8 (eight) hours as needed. Patient not taking: Reported on 07/11/2018 07/10/18   Nita Sickle, MD    Allergies Fish allergy  No family history on file.  Social  History Social History   Tobacco Use  . Smoking status: Never Smoker  . Smokeless tobacco: Never Used  Substance Use Topics  . Alcohol use: No  . Drug use: Not on file    Review of Systems  Constitutional: Positive for fever/chills Eyes: No visual changes. ENT: No sore throat. Cardiovascular: Denies chest pain. Respiratory: Denies shortness of breath. Gastrointestinal: Positive for abdominal pain and nausea, no vomiting.  Positive for diarrhea.  No constipation. Genitourinary: Negative for dysuria. Musculoskeletal: Negative for back pain. Skin: Negative for rash. Neurological: Negative for headaches, focal weakness or numbness.   ____________________________________________   PHYSICAL EXAM:  VITAL SIGNS: ED Triage Vitals  Enc Vitals Group     BP 07/10/18 2315 99/70     Pulse Rate 07/10/18 2315 (!) 102     Resp 07/10/18 2315 16     Temp 07/10/18 2315 (!) 100.7 F (38.2 C)     Temp src --      SpO2 07/10/18 2315 99 %     Weight 07/10/18 2316 130 lb (59 kg)     Height 07/10/18 2316 5\' 8"  (1.727 m)     Head Circumference --      Peak Flow --      Pain Score 07/10/18 2316 10     Pain Loc --      Pain Edu? --      Excl. in GC? --     Constitutional: Alert and oriented. Well appearing and in mild acute distress.  Eyes: Conjunctivae are normal. PERRL. EOMI. Head: Atraumatic. Nose: No congestion/rhinnorhea. Mouth/Throat: Mucous membranes are moist.  Oropharynx non-erythematous. Neck: No stridor.  Supple neck without meningismus. Cardiovascular: Normal rate, regular rhythm. Grossly normal heart sounds.  Good peripheral circulation. Respiratory: Normal respiratory effort.  No retractions. Lungs CTAB. Gastrointestinal: Soft and mildly diffusely tender palpation without rebound or guarding. No distention. No abdominal bruits. No CVA tenderness. Musculoskeletal: No lower extremity tenderness nor edema.  No joint effusions. Neurologic:  Normal speech and language. No gross  focal neurologic deficits are appreciated. No gait instability. Skin:  Skin is warm, dry and intact. No rash noted.  No petechiae. Psychiatric: Mood and affect are normal. Speech and behavior are normal.  ____________________________________________   LABS (all labs ordered are listed, but only abnormal results are displayed)  Labs Reviewed  COMPREHENSIVE METABOLIC PANEL - Abnormal; Notable for the following components:      Result Value   Potassium 3.3 (*)    Glucose, Bld 142 (*)    Total Bilirubin 2.0 (*)    All other components within normal limits  CBC - Abnormal; Notable for the following components:   WBC 18.8 (*)    All other components within normal limits  URINALYSIS, COMPLETE (UACMP) WITH MICROSCOPIC - Abnormal; Notable for the following components:   Color, Urine YELLOW (*)    APPearance CLEAR (*)    Hgb urine dipstick SMALL (*)    Leukocytes, UA SMALL (*)    All other components within normal limits  LACTIC ACID, PLASMA - Abnormal; Notable for the following components:   Lactic Acid, Venous 2.9 (*)    All other components within normal limits  CULTURE, BLOOD (ROUTINE X 2)  CULTURE, BLOOD (ROUTINE X 2)  LIPASE, BLOOD  INFLUENZA PANEL BY PCR (TYPE A & B)  LACTIC ACID, PLASMA   ____________________________________________  EKG  None ____________________________________________  RADIOLOGY  ED MD interpretation: Acute appendicitis  Official radiology report(s): Ct Abdomen Pelvis W Contrast  Result Date: 07/11/2018 CLINICAL DATA:  Abdominal pain and nausea. EXAM: CT ABDOMEN AND PELVIS WITH CONTRAST TECHNIQUE: Multidetector CT imaging of the abdomen and pelvis was performed using the standard protocol following bolus administration of intravenous contrast. CONTRAST:  ISOVUE-300 IOPAMIDOL (ISOVUE-300) INJECTION 61% COMPARISON:  None. FINDINGS: Lower chest: The lung bases are clear of acute process. No pleural effusion or pulmonary lesions. The heart is normal  in size. No pericardial effusion. The distal esophagus and aorta are unremarkable. Hepatobiliary: No focal hepatic lesions or intrahepatic biliary dilatation. The gallbladder is normal. No common bile duct dilatation. Pancreas: No mass, inflammation or ductal dilatation. Spleen: Normal size.  No focal lesions. Adrenals/Urinary Tract: The adrenal glands and kidneys are unremarkable. The bladder appears normal. Stomach/Bowel: The stomach, duodenum and proximal small bowel appear normal. The distal small bowel is slightly dilated and there is mild mesenteric edema involving the distal and terminal ileum region. The colons unremarkable. The appendix is dilated and fluid-filled and also demonstrates mucosal enhancement and periappendiceal inflammatory changes. There is an obstructing 9 mm appendicolith near the orifice. The appendix is low lying in the pelvis adjacent to the bladder and the terminal ileum. Vascular/Lymphatic: The aorta is normal in caliber. No dissection. The branch vessels are patent. The major venous structures are patent. No mesenteric or retroperitoneal mass or adenopathy. Small scattered lymph nodes are noted. Reproductive: Prostate gland seminal vesicles are unremarkable. Other: Small amount of free pelvic fluid but I do not see any findings for perforated appendicitis. No abscess.  Musculoskeletal: No significant bony findings. IMPRESSION: 1. CT findings consistent with acute appendicitis. Associated mesenteric edema, probable distal small bowel ileus and small amount of free pelvic fluid. No findings for perforation or abscess. Low lying appendix deep in the right pelvis adjacent to the bladder. 2. No other significant findings. Electronically Signed   By: Rudie MeyerP.  Gallerani M.D.   On: 07/11/2018 04:47    ____________________________________________   PROCEDURES  Procedure(s) performed: None  Procedures  Critical Care performed:   CRITICAL CARE Performed by: Irean HongSUNG,Eithen Castiglia J   Total  critical care time: 30 minutes  Critical care time was exclusive of separately billable procedures and treating other patients.  Critical care was necessary to treat or prevent imminent or life-threatening deterioration.  Critical care was time spent personally by me on the following activities: development of treatment plan with patient and/or surrogate as well as nursing, discussions with consultants, evaluation of patient's response to treatment, examination of patient, obtaining history from patient or surrogate, ordering and performing treatments and interventions, ordering and review of laboratory studies, ordering and review of radiographic studies, pulse oximetry and re-evaluation of patient's condition.  ____________________________________________   INITIAL IMPRESSION / ASSESSMENT AND PLAN / ED COURSE  As part of my medical decision making, I reviewed the following data within the electronic MEDICAL RECORD NUMBER Nursing notes reviewed and incorporated, Labs reviewed, Old chart reviewed, Radiograph reviewed and Notes from prior ED visits    23 year old male who returns for continued abdominal pain, diarrhea now with fever. Differential diagnosis includes, but is not limited to, acute appendicitis, renal colic, testicular torsion, urinary tract infection/pyelonephritis, prostatitis,  epididymitis, diverticulitis, small bowel obstruction or ileus, colitis, abdominal aortic aneurysm, gastroenteritis, hernia, etc.  White count elevated since last evening.  Will obtain CT abdomen/pelvis to evaluate for intra-abdominal etiology of patient's pain.  Will administer 50 mcg IV fentanyl for pain, paired with 4 mg IV Zofran for nausea.   Clinical Course as of Jul 11 500  Sat Jul 11, 2018  0309 Rechecked oral temperature which is 98.4 F   [JS]  0450 Updated patient of CT imaging results.  Will discuss with Dr. Aleen CampiPiscoya from general surgery for admission.   [JS]    Clinical Course User  Index [JS] Irean HongSung, Scott Vanderveer J, MD     ____________________________________________   FINAL CLINICAL IMPRESSION(S) / ED DIAGNOSES  Final diagnoses:  Right lower quadrant abdominal pain  Fever, unspecified fever cause  Acute appendicitis, unspecified acute appendicitis type     ED Discharge Orders    None       Note:  This document was prepared using Dragon voice recognition software and may include unintentional dictation errors.    Irean HongSung, Jabin Tapp J, MD 07/11/18 [phone]

## 2018-07-11 NOTE — Anesthesia Preprocedure Evaluation (Signed)
Anesthesia Evaluation  Patient identified by MRN, date of birth, ID band Patient awake    Reviewed: Allergy & Precautions, NPO status , Patient's Chart, lab work & pertinent test results  History of Anesthesia Complications Negative for: history of anesthetic complications  Airway Mallampati: I       Dental   Pulmonary asthma (no inhalers for 8 years) , neg sleep apnea, neg COPD,           Cardiovascular (-) hypertension(-) Past MI and (-) CHF (-) dysrhythmias (-) Valvular Problems/Murmurs     Neuro/Psych neg Seizures    GI/Hepatic Neg liver ROS, neg GERD  ,  Endo/Other  neg diabetes  Renal/GU negative Renal ROS     Musculoskeletal   Abdominal   Peds  Hematology   Anesthesia Other Findings   Reproductive/Obstetrics                             Anesthesia Physical Anesthesia Plan  ASA: II and emergent  Anesthesia Plan: General   Post-op Pain Management:    Induction: Intravenous  PONV Risk Score and Plan: 2 and Dexamethasone and Ondansetron  Airway Management Planned: Oral ETT  Additional Equipment:   Intra-op Plan:   Post-operative Plan:   Informed Consent: I have reviewed the patients History and Physical, chart, labs and discussed the procedure including the risks, benefits and alternatives for the proposed anesthesia with the patient or authorized representative who has indicated his/her understanding and acceptance.       Plan Discussed with:   Anesthesia Plan Comments:         Anesthesia Quick Evaluation

## 2018-07-11 NOTE — Anesthesia Post-op Follow-up Note (Signed)
Anesthesia QCDR form completed.        

## 2018-07-11 NOTE — ED Notes (Signed)
ED TO INPATIENT HANDOFF REPORT  Name/Age/Gender Laqueta Due 23 y.o. male  Code Status    Code Status Orders  (From admission, onward)         Start     Ordered   07/11/18 0514  Full code  Continuous     07/11/18 0516        Code Status History    This patient has a current code status but no historical code status.      Home/SNF/Other Home  Chief Complaint Abdominal Pain  Level of Care/Admitting Diagnosis ED Disposition    ED Disposition Condition Comment   Admit  Hospital Area: [facility] [[id number]]  Level of Care: Med-Surg [16]  Diagnosis: Acute appendicitis [917915]  Admitting Physician: Henrene Dodge [[id number]]  Attending Physician: Henrene Dodge [[id number]]  PT Class (Do Not Modify): Observation [104]  PT Acc Code (Do Not Modify): Observation [10022]       Medical History Past Medical History:  Diagnosis Date  . Asthma     Allergies Allergies  Allergen Reactions  . Fish Allergy Shortness Of Breath    IV Location/Drains/Wounds Patient Lines/Drains/Airways Status   Active Line/Drains/Airways    Name:   Placement date:   Placement time:   Site:   Days:   Peripheral IV 07/11/18 Left Hand   07/11/18    0250    Hand   less than 1   Peripheral IV 07/11/18 Right Forearm   07/11/18    0251    Forearm   less than 1          Labs/Imaging Results for orders placed or performed during the hospital encounter of 07/11/18 (from the past 48 hour(s))  Comprehensive metabolic panel     Status: Abnormal   Collection Time: 07/10/18 11:34 PM  Result Value Ref Range   Sodium 137 135 - 145 mmol/L   Potassium 3.3 (L) 3.5 - 5.1 mmol/L   Chloride 103 98 - 111 mmol/L   CO2 25 22 - 32 mmol/L   Glucose, Bld 142 (H) 70 - 99 mg/dL   BUN 9 6 - 20 mg/dL   Creatinine, Ser 7.48 0.61 - 1.24 mg/dL   Calcium 9.1 8.9 - 27.0 mg/dL   Total Protein 7.8 6.5 - 8.1 g/dL   Albumin 5.0 3.5 - 5.0 g/dL   AST 21 15 - 41 U/L   ALT 12 0 - 44 U/L   Alkaline  Phosphatase 64 38 - 126 U/L   Total Bilirubin 2.0 (H) 0.3 - 1.2 mg/dL   GFR calc non Af Amer >60 >60 mL/min   GFR calc Af Amer >60 >60 mL/min   Anion gap 9 5 - 15    Comment: Performed at [facility], [address] [place], [place] [address]  CBC     Status: Abnormal   Collection Time: 07/10/18 11:34 PM  Result Value Ref Range   WBC 18.8 (H) 4.0 - 10.5 K/uL   RBC 4.95 4.22 - 5.81 MIL/uL   Hemoglobin 15.3 13.0 - 17.0 g/dL   HCT 44.9 20.1 - 00.7 %   MCV 86.9 80.0 - 100.0 fL   MCH 30.9 26.0 - 34.0 pg   MCHC 35.6 30.0 - 36.0 g/dL   RDW 12.1 97.5 - 88.3 %   Platelets 178 150 - 400 K/uL   nRBC 0.0 0.0 - 0.2 %    Comment: Performed at [facility], [address] [place], [place] [address]  Urinalysis, Complete w  Microscopic     Status: Abnormal   Collection Time: 07/10/18 11:34 PM  Result Value Ref Range   Color, Urine YELLOW (A) YELLOW   APPearance CLEAR (A) CLEAR   Specific Gravity, Urine 1.011 1.005 - 1.030   pH 5.0 5.0 - 8.0   Glucose, UA NEGATIVE NEGATIVE mg/dL   Hgb urine dipstick SMALL (A) NEGATIVE   Bilirubin Urine NEGATIVE NEGATIVE   Ketones, ur NEGATIVE NEGATIVE mg/dL   Protein, ur NEGATIVE NEGATIVE mg/dL   Nitrite NEGATIVE NEGATIVE   Leukocytes, UA SMALL (A) NEGATIVE   RBC / HPF 0-5 0 - 5 RBC/hpf   WBC, UA 11-20 0 - 5 WBC/hpf   Bacteria, UA NONE SEEN NONE SEEN   Squamous Epithelial / LPF 0-5 0 - 5   Mucus PRESENT     Comment: Performed at [facility], [address] [place], [place] [address]  Lipase, blood     Status: None   Collection Time: 07/10/18 11:34 PM  Result Value Ref Range   Lipase 23 11 - 51 U/L    Comment: Performed at [facility], [address] [place], [place] [address]  Lactic acid, plasma     Status: Abnormal   Collection Time: 07/11/18  2:43 AM  Result Value Ref Range   Lactic Acid, Venous 2.9 (HH) 0.5 - 1.9 mmol/L    Comment: CRITICAL RESULT CALLED TO, READ BACK BY AND VERIFIED WITH Delta Pichon  ON 07/11/18 AT 0322 JAG Performed at [facility], [address] [place], [place] [address]   Influenza panel by PCR (type A & B)     Status: None   Collection Time: 07/11/18  2:43 AM  Result Value Ref Range   Influenza A By PCR NEGATIVE NEGATIVE   Influenza B By PCR NEGATIVE NEGATIVE    Comment: (NOTE) The Xpert Xpress Flu assay is intended as an aid in the diagnosis of  influenza and should not be used as a sole basis for treatment.  This  assay is FDA approved for nasopharyngeal swab specimens only. Nasal  washings and aspirates are unacceptable for Xpert Xpress Flu testing. Performed at [facility], [address] [place], [place] [address]    Ct Abdomen Pelvis W Contrast  Result Date: 07/11/2018 CLINICAL DATA:  Abdominal pain and nausea. EXAM: CT ABDOMEN AND PELVIS WITH CONTRAST TECHNIQUE: Multidetector CT imaging of the abdomen and pelvis was performed using the standard protocol following bolus administration of intravenous contrast. CONTRAST:  100mL ISOVUE-300 IOPAMIDOL (ISOVUE-300) INJECTION 61% COMPARISON:  None. FINDINGS: Lower chest: The lung bases are clear of acute process. No pleural effusion or pulmonary lesions. The heart is normal in size. No pericardial effusion. The distal esophagus and aorta are unremarkable. Hepatobiliary: No focal hepatic lesions or intrahepatic biliary dilatation. The gallbladder is normal. No common bile duct dilatation. Pancreas: No mass, inflammation or ductal dilatation. Spleen: Normal size.  No focal lesions. Adrenals/Urinary Tract: The adrenal glands and kidneys are unremarkable. The bladder appears normal. Stomach/Bowel: The stomach, duodenum and proximal small bowel appear normal. The distal small bowel is slightly dilated and there is mild mesenteric edema involving the distal and terminal ileum region. The colons unremarkable. The appendix is dilated and fluid-filled and also demonstrates mucosal enhancement and  periappendiceal inflammatory changes. There is an obstructing 9 mm appendicolith near the orifice. The appendix is low lying in the pelvis adjacent to the bladder and the terminal ileum. Vascular/Lymphatic: The aorta is normal in caliber. No dissection. The  branch vessels are patent. The major venous structures are patent. No mesenteric or retroperitoneal mass or adenopathy. Small scattered lymph nodes are noted. Reproductive: Prostate gland seminal vesicles are unremarkable. Other: Small amount of free pelvic fluid but I do not see any findings for perforated appendicitis. No abscess. Musculoskeletal: No significant bony findings. IMPRESSION: 1. CT findings consistent with acute appendicitis. Associated mesenteric edema, probable distal small bowel ileus and small amount of free pelvic fluid. No findings for perforation or abscess. Low lying appendix deep in the right pelvis adjacent to the bladder. 2. No other significant findings. Electronically Signed   By: Rudie Meyer M.D.   On: 07/11/2018 04:47    Pending Labs Unresulted Labs (From admission, onward)    Start     Ordered   07/12/18 0500  Basic metabolic panel  Tomorrow morning,   STAT     07/11/18 0516   07/12/18 0500  Magnesium  Tomorrow morning,   STAT     07/11/18 0516   07/12/18 0500  CBC  Tomorrow morning,   STAT     07/11/18 0516   07/11/18 0514  HIV antibody (Routine Testing)  Once,   STAT     07/11/18 0516   07/11/18 0232  Lactic acid, plasma  Now then every 2 hours,   STAT     07/11/18 0234   07/11/18 0232  Culture, blood (routine x 2)  BLOOD CULTURE X 2,   STAT     07/11/18 0234          Vitals/Pain Today's Vitals   07/11/18 0310 07/11/18 0328 07/11/18 0356 07/11/18 0512  BP:    121/66  Pulse:    99  Resp:    16  Temp: 98.4 F (36.9 C)     TempSrc: Oral     SpO2:    99%  Weight:      Height:      PainSc:  9  8      Isolation Precautions No active isolations  Medications Medications  lactated ringers  infusion (has no administration in time range)  ketorolac (TORADOL) 30 MG/ML injection 30 mg (30 mg Intravenous Given 07/11/18 0530)  HYDROmorphone (DILAUDID) injection 0.5 mg (has no administration in time range)  polyethylene glycol (MIRALAX / GLYCOLAX) packet 17 g (has no administration in time range)  ondansetron (ZOFRAN-ODT) disintegrating tablet 4 mg (has no administration in time range)    Or  ondansetron (ZOFRAN) injection 4 mg (has no administration in time range)  pantoprazole (PROTONIX) injection 40 mg (has no administration in time range)  enoxaparin (LOVENOX) injection 40 mg (has no administration in time range)  piperacillin-tazobactam (ZOSYN) IVPB 3.375 g (has no administration in time range)  sodium chloride 0.9 % bolus 1,000 mL (0 mLs Intravenous Stopped 07/11/18 0453)  acetaminophen (TYLENOL) tablet 1,000 mg (1,000 mg Oral Given 07/11/18 0244)  iopamidol (ISOVUE-300) 61 % injection 30 mL (30 mLs Oral Contrast Given 07/11/18 0248)  fentaNYL (SUBLIMAZE) injection 50 mcg (50 mcg Intravenous Given 07/11/18 0335)  ondansetron (ZOFRAN) injection 4 mg (4 mg Intravenous Given 07/11/18 0333)  sodium chloride 0.9 % bolus 1,000 mL (0 mLs Intravenous Stopped 07/11/18 0542)  piperacillin-tazobactam (ZOSYN) IVPB 3.375 g (0 g Intravenous Stopped 07/11/18 0447)  iopamidol (ISOVUE-300) 61 % injection 100 mL (100 mLs Intravenous Contrast Given 07/11/18 0424)    Mobility walks

## 2018-07-11 NOTE — ED Notes (Signed)
Pt to CT at this time.

## 2018-07-11 NOTE — H&P (Signed)
Date of Admission:  07/11/2018  Reason for Admission:  Acute appendicitis  History of Present Illness: Edward Rosario is a 23 y.o. male presenting with a one day history of lower abdominal pain.  He presented to the ED yesterday in the daytime and his work-up included a labs which showed a WBC of 15.3.  The emergency room reports that he had 3 episodes of diarrhea but the patient currently denies that he had any and is not sure why that was included in his chart.  He reports that his main symptom was only lower abdominal pain.  Was low suspicion for appendicitis given his diarrhea and instead was thought to have a viral gastroenteritis, was rehydrated and discharged home.  However his pain continued and last night he presented again to the emergency room after having an episode of fever at home.  He also had nausea but no emesis.  In the emergency room he again had laboratory work-up which showed a white blood cell count of 18.8.  At that point a CT scan was obtained which showed acute appendicitis with a dilated appendix with periappendiceal inflammatory changes as well as an obstructing appendicolith at the origin of the appendix.  I have independently viewed the imaging study and agree with the findings.  His lactic acid was also elevated at 2.9.  Past Medical History: Past Medical History:  Diagnosis Date  . Asthma      Past Surgical History: No past surgical history on file.  Home Medications: Prior to Admission medications   Medication Sig Start Date End Date Taking? Authorizing Provider  ibuprofen (ADVIL,MOTRIN) 600 MG tablet Take 1 tablet (600 mg total) by mouth every 8 (eight) hours as needed. Patient not taking: Reported on 07/11/2018 01/31/16   Joni ReiningSmith, Ronald K, PA-C  meloxicam (MOBIC) 15 MG tablet Take 1 tablet (15 mg total) by mouth daily. Patient not taking: Reported on 07/11/2018 04/07/18   Cuthriell, Delorise RoyalsJonathan D, PA-C  methocarbamol (ROBAXIN) 500 MG tablet Take 1 tablet (500 mg  total) by mouth 4 (four) times daily. Patient not taking: Reported on 07/11/2018 04/07/18   Cuthriell, Delorise RoyalsJonathan D, PA-C  ondansetron (ZOFRAN ODT) 4 MG disintegrating tablet Take 1 tablet (4 mg total) by mouth every 8 (eight) hours as needed. Patient not taking: Reported on 07/11/2018 07/10/18   Nita SickleVeronese, [place], MD    Allergies: Allergies  Allergen Reactions  . Fish Allergy Shortness Of Breath    Social History:  reports that he has never smoked. He has never used smokeless tobacco. He reports that he does not drink alcohol. No history on file for drug.   Family History: No family history of appendicitis  Review of Systems: Review of Systems  Constitutional: Positive for fever. Negative for chills.  HENT: Negative for hearing loss.   Respiratory: Negative for shortness of breath.   Cardiovascular: Negative for chest pain.  Gastrointestinal: Positive for abdominal pain and nausea. Negative for constipation, diarrhea and vomiting.  Genitourinary: Negative for dysuria.  Musculoskeletal: Negative for myalgias.  Skin: Negative for rash.  Neurological: Negative for dizziness.  Psychiatric/Behavioral: Negative for depression.    Physical Exam BP 121/66 (BP Location: Right Arm)   Pulse 99   Temp 98.4 F (36.9 C) (Oral)   Resp 16   Ht 5\' 8"  (1.727 m)   Wt 59 kg   SpO2 99%   BMI 19.77 kg/m  CONSTITUTIONAL: No acute distress HEENT:  Normocephalic, atraumatic, extraocular motion intact. NECK: Trachea is midline, and there is no  jugular venous distension.  RESPIRATORY:  Lungs are clear, and breath sounds are equal bilaterally. Normal respiratory effort without pathologic use of accessory muscles. CARDIOVASCULAR: Heart is regular without murmurs, gallops, or rubs. GI: The abdomen is soft, nondistended, with tenderness to palpation in the right lower quadrant at McBurney's point consistent with appendicitis.  MUSCULOSKELETAL:  Normal muscle strength and tone in all four extremities.  No  peripheral edema or cyanosis. SKIN: Skin turgor is normal. There are no pathologic skin lesions.  NEUROLOGIC:  Motor and sensation is grossly normal.  Cranial nerves are grossly intact. PSYCH:  Alert and oriented to person, place and time. Affect is normal.  Laboratory Analysis: Results for orders placed or performed during the hospital encounter of 07/11/18 (from the past 24 hour(s))  Comprehensive metabolic panel     Status: Abnormal   Collection Time: 07/10/18 11:34 PM  Result Value Ref Range   Sodium 137 135 - 145 mmol/L   Potassium 3.3 (L) 3.5 - 5.1 mmol/L   Chloride 103 98 - 111 mmol/L   CO2 25 22 - 32 mmol/L   Glucose, Bld 142 (H) 70 - 99 mg/dL   BUN 9 6 - 20 mg/dL   Creatinine, Ser 7.48 0.61 - 1.24 mg/dL   Calcium 9.1 8.9 - 27.0 mg/dL   Total Protein 7.8 6.5 - 8.1 g/dL   Albumin 5.0 3.5 - 5.0 g/dL   AST 21 15 - 41 U/L   ALT 12 0 - 44 U/L   Alkaline Phosphatase 64 38 - 126 U/L   Total Bilirubin 2.0 (H) 0.3 - 1.2 mg/dL   GFR calc non Af Amer >60 >60 mL/min   GFR calc Af Amer >60 >60 mL/min   Anion gap 9 5 - 15  CBC     Status: Abnormal   Collection Time: 07/10/18 11:34 PM  Result Value Ref Range   WBC 18.8 (H) 4.0 - 10.5 K/uL   RBC 4.95 4.22 - 5.81 MIL/uL   Hemoglobin 15.3 13.0 - 17.0 g/dL   HCT 78.6 75.4 - 49.2 %   MCV 86.9 80.0 - 100.0 fL   MCH 30.9 26.0 - 34.0 pg   MCHC 35.6 30.0 - 36.0 g/dL   RDW 01.0 07.1 - 21.9 %   Platelets 178 150 - 400 K/uL   nRBC 0.0 0.0 - 0.2 %  Urinalysis, Complete w Microscopic     Status: Abnormal   Collection Time: 07/10/18 11:34 PM  Result Value Ref Range   Color, Urine YELLOW (A) YELLOW   APPearance CLEAR (A) CLEAR   Specific Gravity, Urine 1.011 1.005 - 1.030   pH 5.0 5.0 - 8.0   Glucose, UA NEGATIVE NEGATIVE mg/dL   Hgb urine dipstick SMALL (A) NEGATIVE   Bilirubin Urine NEGATIVE NEGATIVE   Ketones, ur NEGATIVE NEGATIVE mg/dL   Protein, ur NEGATIVE NEGATIVE mg/dL   Nitrite NEGATIVE NEGATIVE   Leukocytes, UA SMALL (A)  NEGATIVE   RBC / HPF 0-5 0 - 5 RBC/hpf   WBC, UA 11-20 0 - 5 WBC/hpf   Bacteria, UA NONE SEEN NONE SEEN   Squamous Epithelial / LPF 0-5 0 - 5   Mucus PRESENT   Lipase, blood     Status: None   Collection Time: 07/10/18 11:34 PM  Result Value Ref Range   Lipase 23 11 - 51 U/L  Lactic acid, plasma     Status: Abnormal   Collection Time: 07/11/18  2:43 AM  Result Value Ref Range   Lactic Acid,  Venous 2.9 (HH) 0.5 - 1.9 mmol/L  Influenza panel by PCR (type A & B)     Status: None   Collection Time: 07/11/18  2:43 AM  Result Value Ref Range   Influenza A By PCR NEGATIVE NEGATIVE   Influenza B By PCR NEGATIVE NEGATIVE  Lactic acid, plasma     Status: None   Collection Time: 07/11/18  5:46 AM  Result Value Ref Range   Lactic Acid, Venous 0.8 0.5 - 1.9 mmol/L    Imaging: Ct Abdomen Pelvis W Contrast  Result Date: 07/11/2018 CLINICAL DATA:  Abdominal pain and nausea. EXAM: CT ABDOMEN AND PELVIS WITH CONTRAST TECHNIQUE: Multidetector CT imaging of the abdomen and pelvis was performed using the standard protocol following bolus administration of intravenous contrast. CONTRAST:  100mL ISOVUE-300 IOPAMIDOL (ISOVUE-300) INJECTION 61% COMPARISON:  None. FINDINGS: Lower chest: The lung bases are clear of acute process. No pleural effusion or pulmonary lesions. The heart is normal in size. No pericardial effusion. The distal esophagus and aorta are unremarkable. Hepatobiliary: No focal hepatic lesions or intrahepatic biliary dilatation. The gallbladder is normal. No common bile duct dilatation. Pancreas: No mass, inflammation or ductal dilatation. Spleen: Normal size.  No focal lesions. Adrenals/Urinary Tract: The adrenal glands and kidneys are unremarkable. The bladder appears normal. Stomach/Bowel: The stomach, duodenum and proximal small bowel appear normal. The distal small bowel is slightly dilated and there is mild mesenteric edema involving the distal and terminal ileum region. The colons  unremarkable. The appendix is dilated and fluid-filled and also demonstrates mucosal enhancement and periappendiceal inflammatory changes. There is an obstructing 9 mm appendicolith near the orifice. The appendix is low lying in the pelvis adjacent to the bladder and the terminal ileum. Vascular/Lymphatic: The aorta is normal in caliber. No dissection. The branch vessels are patent. The major venous structures are patent. No mesenteric or retroperitoneal mass or adenopathy. Small scattered lymph nodes are noted. Reproductive: Prostate gland seminal vesicles are unremarkable. Other: Small amount of free pelvic fluid but I do not see any findings for perforated appendicitis. No abscess. Musculoskeletal: No significant bony findings. IMPRESSION: 1. CT findings consistent with acute appendicitis. Associated mesenteric edema, probable distal small bowel ileus and small amount of free pelvic fluid. No findings for perforation or abscess. Low lying appendix deep in the right pelvis adjacent to the bladder. 2. No other significant findings. Electronically Signed   By: Rudie MeyerP.  Gallerani M.D.   On: 07/11/2018 04:47    Assessment and Plan: This is a 23 y.o. male with acute appendicitis.  Discussed with the patient that given his findings we did not want to wait till later in the day when we would have better or availability and instead will do his appendectomy now.  Discussed with him the role for laparoscopic appendectomy including incisions that we would make.  The risks of bleeding, infection, injury to surrounding structures were discussed with the patient he is willing to proceed.  Discussed with him that following surgery will keep him today for IV antibiotics and possibly discharge him tomorrow depending how he is doing.  He will have appropriate IV antibiotics, appropriate pain and nausea control, DVT prophylaxis and GI prophylaxis.   Edward IllJose Luis Jaquell Seddon, MD [facility] Pg:  [phone]

## 2018-07-11 NOTE — Anesthesia Procedure Notes (Signed)
Procedure Name: Intubation Date/Time: 07/11/2018 6:50 AM Performed by: Chanetta Marshall, CRNA Pre-anesthesia Checklist: Patient identified, Emergency Drugs available, Suction available and Patient being monitored Patient Re-evaluated:Patient Re-evaluated prior to induction Oxygen Delivery Method: Circle system utilized Preoxygenation: Pre-oxygenation with 100% oxygen Induction Type: IV induction Ventilation: Mask ventilation without difficulty Laryngoscope Size: Mac and 3 Grade View: Grade I Tube size: 7.0 mm Number of attempts: 1 Airway Equipment and Method: Stylet Placement Confirmation: ETT inserted through vocal cords under direct vision,  positive ETCO2,  CO2 detector and breath sounds checked- equal and bilateral Secured at: 20 cm Tube secured with: Tape Dental Injury: Teeth and Oropharynx as per pre-operative assessment

## 2018-07-11 NOTE — Transfer of Care (Signed)
Immediate Anesthesia Transfer of Care Note  Patient: Edward Rosario  Procedure(s) Performed: APPENDECTOMY LAPAROSCOPIC (N/A )  Patient Location: PACU  Anesthesia Type:General  Level of Consciousness: oriented, drowsy and patient cooperative  Airway & Oxygen Therapy: Patient Spontanous Breathing and Patient connected to nasal cannula oxygen  Post-op Assessment: Report given to RN and Post -op Vital signs reviewed and stable  Post vital signs: Reviewed and stable  Last Vitals:  Vitals Value Taken Time  BP    Temp    Pulse 81 07/11/2018  8:20 AM  Resp 15 07/11/2018  8:20 AM  SpO2 96 % 07/11/2018  8:20 AM  Vitals shown include unvalidated device data.  Last Pain:  Vitals:   07/11/18 0356  TempSrc:   PainSc: 8          Complications: No apparent anesthesia complications

## 2018-07-12 ENCOUNTER — Encounter: Payer: Self-pay | Admitting: Surgery

## 2018-07-12 LAB — BASIC METABOLIC PANEL
Anion gap: 5 (ref 5–15)
BUN: 11 mg/dL (ref 6–20)
CO2: 24 mmol/L (ref 22–32)
CREATININE: 0.75 mg/dL (ref 0.61–1.24)
Calcium: 8.2 mg/dL — ABNORMAL LOW (ref 8.9–10.3)
Chloride: 110 mmol/L (ref 98–111)
GFR calc Af Amer: >60 mL/min (ref >60)
GFR calc non Af Amer: >60 mL/min (ref >60)
Glucose, Bld: 126 mg/dL — ABNORMAL HIGH (ref 70–99)
Potassium: 3.3 mmol/L — ABNORMAL LOW (ref 3.5–5.1)
Sodium: 139 mmol/L (ref 135–145)

## 2018-07-12 LAB — CBC
HEMATOCRIT: 32.2 % — AB (ref 39.0–52.0)
Hemoglobin: 11.1 g/dL — ABNORMAL LOW (ref 13.0–17.0)
MCH: 30.6 pg (ref 26.0–34.0)
MCHC: 34.5 g/dL (ref 30.0–36.0)
MCV: 88.7 fL (ref 80.0–100.0)
Platelets: 109 10*3/uL — ABNORMAL LOW (ref 150–400)
RBC: 3.63 MIL/uL — ABNORMAL LOW (ref 4.22–5.81)
RDW: 12.7 % (ref 11.5–15.5)
WBC: 10.8 10*3/uL — AB (ref 4.0–10.5)
nRBC: 0 % (ref 0.0–0.2)

## 2018-07-12 LAB — MAGNESIUM: Magnesium: 1.9 mg/dL (ref 1.7–2.4)

## 2018-07-12 MED ORDER — AMOXICILLIN-POT CLAVULANATE 875-125 MG PO TABS: 1.0000 | ORAL_TABLET | Freq: Two times a day (BID) | ORAL | 0 refills | Status: DC

## 2018-07-12 MED ORDER — AMOXICILLIN-POT CLAVULANATE 875-125 MG PO TABS: 1.0000 | ORAL_TABLET | Freq: Two times a day (BID) | ORAL | Status: DC

## 2018-07-12 MED ORDER — OXYCODONE HCL 5 MG PO TABS: 5.0000 mg | ORAL_TABLET | ORAL | 0 refills | Status: DC | PRN

## 2018-07-12 MED ORDER — POTASSIUM CHLORIDE CRYS ER 20 MEQ PO TBCR
40.0000 meq | EXTENDED_RELEASE_TABLET | Freq: Once | ORAL | Status: AC
  Administered 2018-07-12: 40 meq via ORAL
  Filled 2018-07-12: qty 2

## 2018-07-12 MED ORDER — IBUPROFEN 600 MG PO TABS: 600.0000 mg | ORAL_TABLET | Freq: Three times a day (TID) | ORAL | 0 refills | Status: DC | PRN

## 2018-07-12 NOTE — Discharge Summary (Signed)
Patient ID: Edward Rosario MRN: [id number] DOB/AGE: [birth year] 23 y.o.  Admit date: 07/11/2018 Discharge date: 07/12/2018   Discharge Diagnoses:  Active Problems:   Acute appendicitis   Procedures:  Laparoscopic appendectomy  Hospital Course:  Patient was admitted on 2/8 with acute appendicitis and taken to OR same day.  A Blake drain was left in place due to seropurulent fluid.  His post-op recovery was uneventful.  His diet was slowly advanced, his pain was well controlled, and his WBC trended down.  On exam, he was in no acute distress, with stable vital signs.  His abdomen was soft, non-distended, and appropriately tender to palpation.  Incisions were clean, dry, intact.  Blake drain was serosangouinous.  Consults: None  Disposition: Discharge disposition: 01-Home or Self Care       Discharge Instructions    Call MD for:  difficulty breathing, headache or visual disturbances   Complete by:  As directed    Call MD for:  persistant nausea and vomiting   Complete by:  As directed    Call MD for:  redness, tenderness, or signs of infection (pain, swelling, redness, odor or green/yellow discharge around incision site)   Complete by:  As directed    Call MD for:  severe uncontrolled pain   Complete by:  As directed    Call MD for:  temperature >100.4   Complete by:  As directed    Change dressing (specify)   Complete by:  As directed    Change dressing around drain once daily to keep area clean.  May use gauze and paper tape.   Diet - low sodium heart healthy   Complete by:  As directed    Discharge instructions   Complete by:  As directed    1.  Patient may shower, but do not scrub wounds heavily and dab dry only. 2.  Do not submerge wounds in pool/tub for 1 week. 3.  Do not apply ointments or hydrogen peroxide to the wounds. 4.  Do not remove drain.   5.  Empty and record drain output twice daily.  Bring records with you to the office appointment.   Driving  Restrictions   Complete by:  As directed    Do not drive while taking narcotics for pain control.   Increase activity slowly   Complete by:  As directed    Lifting restrictions   Complete by:  As directed    No heavy lifting or pushing of more than 10-15 lbs for 4 weeks.     Allergies as of 07/12/2018      Reactions   Fish Allergy Shortness Of Breath      Medication List    TAKE these medications   amoxicillin-clavulanate 875-125 MG tablet Commonly known as:  AUGMENTIN Take 1 tablet by mouth every 12 (twelve) hours.   ibuprofen 600 MG tablet Commonly known as:  ADVIL,MOTRIN Take 1 tablet (600 mg total) by mouth every 8 (eight) hours as needed for fever, mild pain or moderate pain.   oxyCODONE 5 MG immediate release tablet Commonly known as:  Oxy IR/ROXICODONE Take 1 tablet (5 mg total) by mouth every 4 (four) hours as needed for moderate pain or severe pain.            Discharge Care Instructions  (From admission, onward)         Start     Ordered   07/12/18 0000  Change dressing (specify)    Comments:  Change dressing  around drain once daily to keep area clean.  May use gauze and paper tape.   07/12/18 3291         Follow-up Information    Henrene Dodge, MD Follow up on 07/15/2018.   Specialty:  General Surgery Why:  Please call to schedule follow up appointment on 2/12 for drain removal. Contact information: [address] [place] [address] [phone]

## 2018-07-12 NOTE — Progress Notes (Signed)
Discharge order received. Patient is alert and oriented. Vital signs stable . No signs of acute distress. Discharge instructions given. Patient verbalized understanding. No other issues noted at this time.   

## 2018-07-13 ENCOUNTER — Telehealth: Payer: Self-pay | Admitting: *Deleted

## 2018-07-13 NOTE — Telephone Encounter (Signed)
Patient to see Dr. Aleen Campi on 07/15/2018 @ 9

## 2018-07-14 LAB — SURGICAL PATHOLOGY

## 2018-07-14 LAB — HIV ANTIBODY (ROUTINE TESTING W REFLEX): HIV Screen 4th Generation wRfx: NONREACTIVE

## 2018-07-15 ENCOUNTER — Ambulatory Visit: Payer: Self-pay | Admitting: Surgery

## 2018-07-15 NOTE — Anesthesia Postprocedure Evaluation (Signed)
Anesthesia Post Note  Patient: Edward Rosario  Procedure(s) Performed: APPENDECTOMY LAPAROSCOPIC (N/A )  Patient location during evaluation: PACU Anesthesia Type: General Level of consciousness: awake and alert Pain management: pain level controlled Vital Signs Assessment: post-procedure vital signs reviewed and stable Respiratory status: spontaneous breathing and respiratory function stable Cardiovascular status: stable Anesthetic complications: no     Last Vitals:  Vitals:   07/11/18 1927 07/12/18 0445  BP: 103/63 99/67  Pulse: 76 (!) 58  Resp: 16 16  Temp: 36.8 C 37 C  SpO2: 98% 98%    Last Pain:  Vitals:   07/12/18 0605  TempSrc:   PainSc: 0-No pain                 KEPHART,WILLIAM K

## 2018-07-16 LAB — CULTURE, BLOOD (ROUTINE X 2)
Culture: NO GROWTH
Culture: NO GROWTH
Special Requests: ADEQUATE

## 2018-07-17 ENCOUNTER — Other Ambulatory Visit: Payer: Self-pay

## 2018-07-17 ENCOUNTER — Ambulatory Visit (INDEPENDENT_AMBULATORY_CARE_PROVIDER_SITE_OTHER): Payer: Self-pay | Admitting: Surgery

## 2018-07-17 ENCOUNTER — Encounter: Payer: Self-pay | Admitting: Surgery

## 2018-07-17 VITALS — BP 107/66 | HR 77 | Temp 97.3°F | Ht 68.0 in | Wt 130.0 lb

## 2018-07-17 DIAGNOSIS — K3580 Unspecified acute appendicitis: Secondary | ICD-10-CM

## 2018-07-17 DIAGNOSIS — Z09 Encounter for follow-up examination after completed treatment for conditions other than malignant neoplasm: Secondary | ICD-10-CM

## 2018-07-17 NOTE — Progress Notes (Signed)
07/17/2018  HPI: Edward Rosario is a 23 y.o. male s/p laparoscopic appendectomy on 07/11/2018.  Drain was left in place.  Patient presents today for follow-up.  Denies any abdominal pain reports some discomfort at the drain site.  Denies any significant volume of fluid drainage has not really had to empty it.  Tolerating diet well and wants to eat more.  Vital signs: BP 107/66   Pulse 77   Temp (!) 97.3 F (36.3 C) (Skin)   Ht 5\' 8"  (1.727 m)   Wt 130 lb (59 kg)   SpO2 98%   BMI 19.77 kg/m    Physical Exam: Constitutional: No acute distress Abdomen: Soft, nondistended, nontender to palpation.  Incisions are clean dry and intact.  Drain with serosanguineous fluid was removed with no complications and dry gauze dressing applied.  Assessment/Plan: This is a 23 y.o. male s/p laparoscopic appendectomy.  -Patient still has no heavy lifting restriction of no more than 10 to 15 pounds for another 3 weeks. -He is to apply dry gauze dressing to the drain site until the wound is fully closed. - Should continue antibiotics until course completed. - May follow-up as needed.   Howie Ill, MD [facility]

## 2018-07-17 NOTE — Patient Instructions (Signed)
Return as needed.The patient is aware to call back for any questions or concerns.  

## 2019-01-29 ENCOUNTER — Other Ambulatory Visit: Payer: Self-pay

## 2019-01-29 ENCOUNTER — Emergency Department
Admission: EM | Admit: 2019-01-29 | Discharge: 2019-01-29 | Disposition: A | Discharge status: Home / Self Care | Payer: Medicaid Other | Attending: Emergency Medicine | Admitting: Emergency Medicine

## 2019-01-29 ENCOUNTER — Encounter: Payer: Self-pay | Admitting: Emergency Medicine

## 2019-01-29 ENCOUNTER — Encounter: Payer: Self-pay | Admitting: Physician Assistant

## 2019-01-29 ENCOUNTER — Ambulatory Visit: Payer: Self-pay | Admitting: Physician Assistant

## 2019-01-29 DIAGNOSIS — Z711 Person with feared health complaint in whom no diagnosis is made: Secondary | ICD-10-CM | POA: Insufficient documentation

## 2019-01-29 DIAGNOSIS — J45909 Unspecified asthma, uncomplicated: Secondary | ICD-10-CM | POA: Insufficient documentation

## 2019-01-29 DIAGNOSIS — Z113 Encounter for screening for infections with a predominantly sexual mode of transmission: Secondary | ICD-10-CM

## 2019-01-29 DIAGNOSIS — Z202 Contact with and (suspected) exposure to infections with a predominantly sexual mode of transmission: Secondary | ICD-10-CM

## 2019-01-29 LAB — GRAM STAIN

## 2019-01-29 MED ORDER — AZITHROMYCIN 500 MG PO TABS
1000.0000 mg | ORAL_TABLET | Freq: Once | ORAL | Status: AC
  Administered 2019-01-29: 18:00:00 1000 mg via ORAL

## 2019-01-29 NOTE — ED Notes (Signed)
Pt verbalized understanding of discharge instructions. NAD at this time. 
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
## 2019-01-29 NOTE — Progress Notes (Signed)
    STI clinic/screening visit  Subjective:  Edward Rosario is a 23 y.o. male being seen today for an STI screening visit. The patient reports they do have symptoms.  Patient has the following medical conditions:   Patient Active Problem List   Diagnosis Date Noted  . Acute appendicitis 07/11/2018     Chief Complaint  Patient presents with  . SEXUALLY TRANSMITTED DISEASE    HPI  Patient reports that he has had slight dysuria off and on for a few weeks and that he is also a contact to Chlamydia.  Denies any/all other symptoms.  See flowsheet for further details and programmatic requirements.    The following portions of the patient's history were reviewed and updated as appropriate: allergies, current medications, past medical history, past social history, past surgical history and problem list.  Objective:  There were no vitals filed for this visit.  Physical Exam Constitutional:      General: He is not in acute distress.    Appearance: Normal appearance.  HENT:     Head: Normocephalic and atraumatic.     Mouth/Throat:     Mouth: Mucous membranes are moist.     Pharynx: Oropharynx is clear. No oropharyngeal exudate or posterior oropharyngeal erythema.  Eyes:     Conjunctiva/sclera: Conjunctivae normal.  Neck:     Musculoskeletal: Neck supple.  Pulmonary:     Effort: Pulmonary effort is normal.  Abdominal:     Palpations: Abdomen is soft. There is no mass.     Tenderness: There is no abdominal tenderness. There is no guarding or rebound.  Genitourinary:    Penis: Normal.      Scrotum/Testes: Normal.     Comments: Pubic area without nits, lice, edema, erythema, lesions and inguinal adenopathy. Penis is uncircumcised and without discharge at meatus. Lymphadenopathy:     Cervical: No cervical adenopathy.  Skin:    General: Skin is warm and dry.     Findings: No bruising, erythema, lesion or rash.  Neurological:     Mental Status: He is alert and oriented to  person, place, and time.  Psychiatric:        Mood and Affect: Mood normal.        Behavior: Behavior normal.        Thought Content: Thought content normal.        Judgment: Judgment normal.       Assessment and Plan:  Edward Rosario is a 23 y.o. male presenting to the [facility] Department for STI screening  1. Screening for STD (sexually transmitted disease) Patient is a contact to Chlamydia and is having symptoms today. Rec condoms with all sex Await test results.  Counseled that RN will call if needs to RTC for further treatment once results are back.  - Gram stain - Gonococcus culture - HIV [facility] - Syphilis Serology, [facility]  2. Chlamydia contact Will treat with Azithromycin 1g po DOT today. No sex for 7 days and until after partner completes treatment. RTC if vomits < 2 hr after taking medicine for re-treatment.     No follow-ups on file.  No future appointments.  Jerene Dilling, PA

## 2019-01-29 NOTE — Discharge Instructions (Addendum)
Advised to follow-up with the health department for definitive evaluation and treatment.

## 2019-01-29 NOTE — ED Triage Notes (Signed)
Pt presents to ED via POV requesting STD test. Pt states recent unprotected sex with a woman who was tested for an STD, pt unable to state which STD which she tested positive.

## 2019-02-02 LAB — GONOCOCCUS CULTURE

## 2019-09-28 IMAGING — CT CT ABD-PELV W/ CM
2 of 4 series · 15 of 46 positions shown, 17 images · IV contrast (APPLIED)
Comparison: None.

CLINICAL DATA: Abdominal pain and nausea.

EXAM:
CT ABDOMEN AND PELVIS WITH CONTRAST
TECHNIQUE: Multidetector CT imaging of the abdomen and pelvis was performed
using the standard protocol following bolus administration of
intravenous contrast.
CONTRAST:  100mL 3XBB1W-IEE IOPAMIDOL (3XBB1W-IEE) INJECTION 61%

[Series 2: routine abd/pel with · axial · 0.64mm/px · z∈[-968,-588]mm · 12 of 90 slices shown, 14 images]
[im 7/90  soft-tissue]
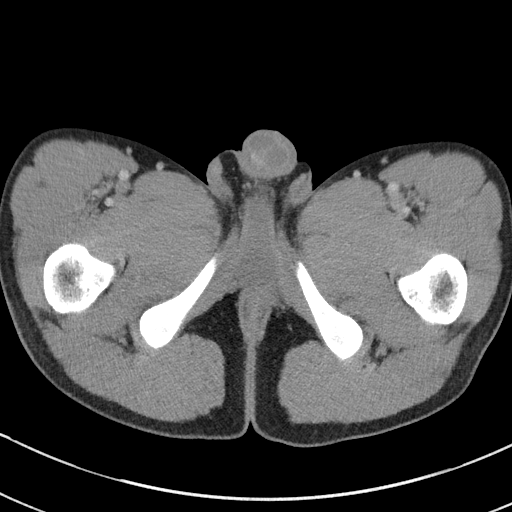
[im 7/90  bone]
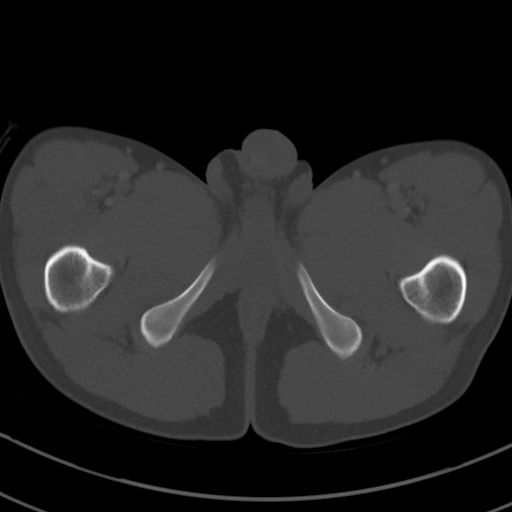
[im 14/90  soft-tissue]
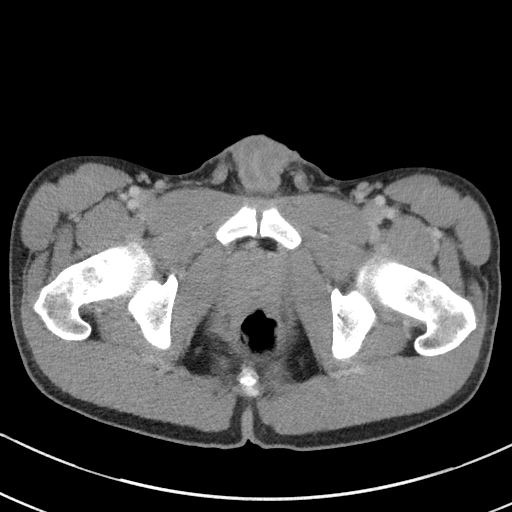
[im 21/90  soft-tissue]
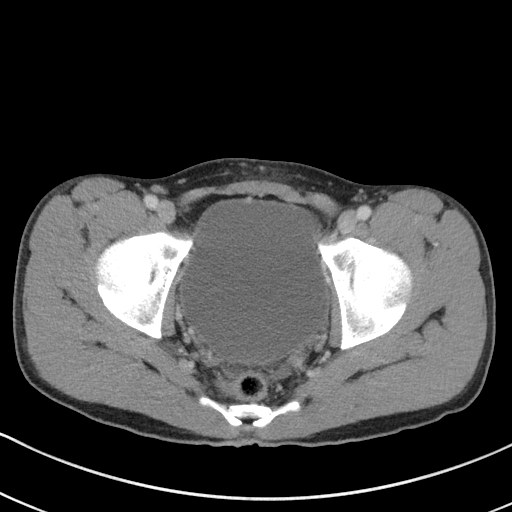
[im 28/90  soft-tissue]
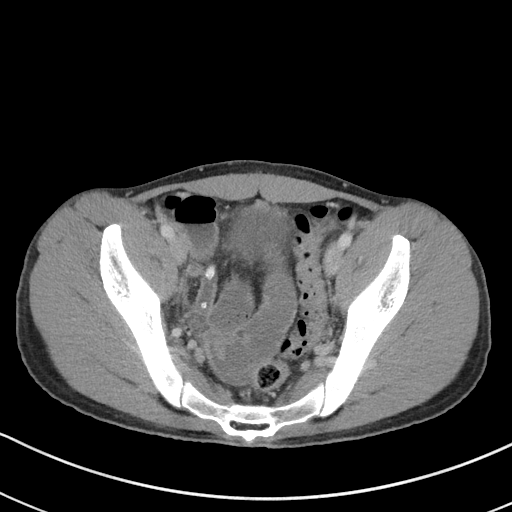
[im 35/90  soft-tissue]
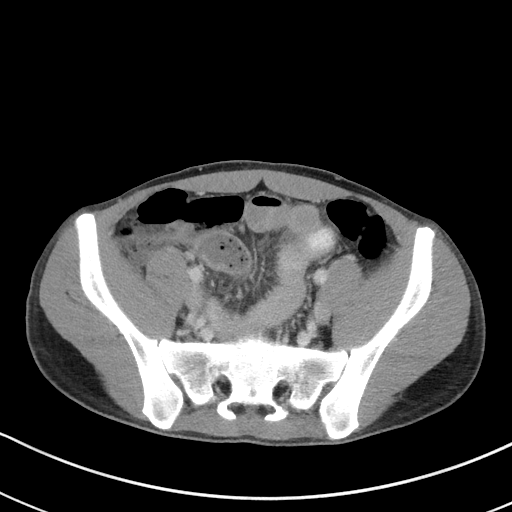
[im 42/90  soft-tissue]
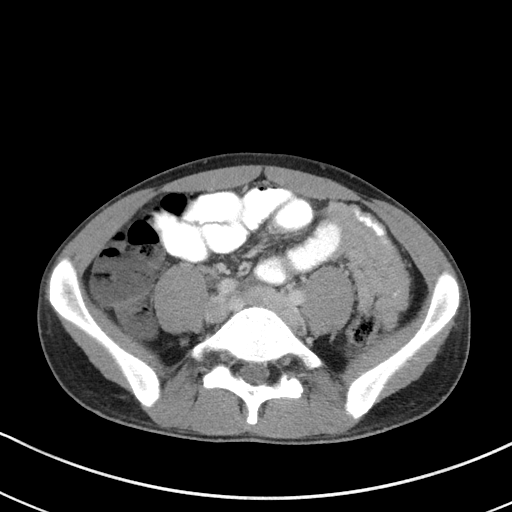
[im 48/90  soft-tissue]
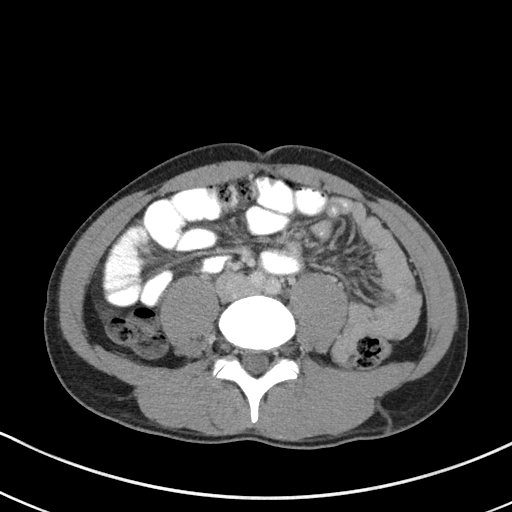
[im 55/90  soft-tissue]
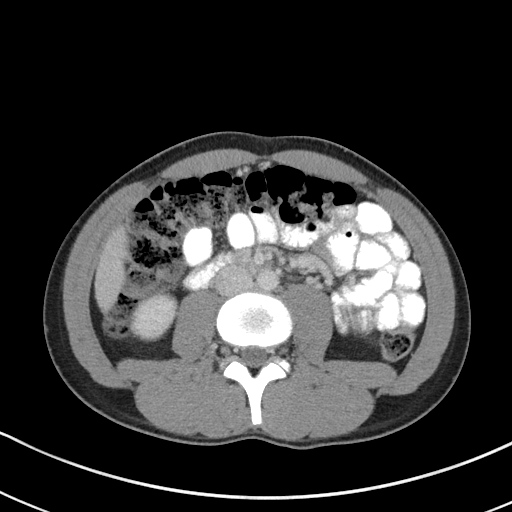
[im 62/90  soft-tissue]
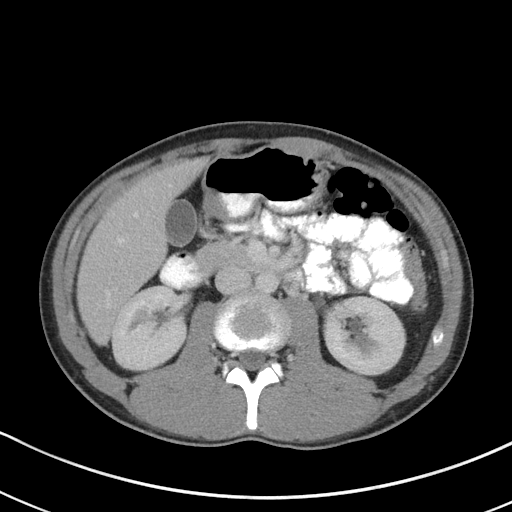
[im 62/90  bone]
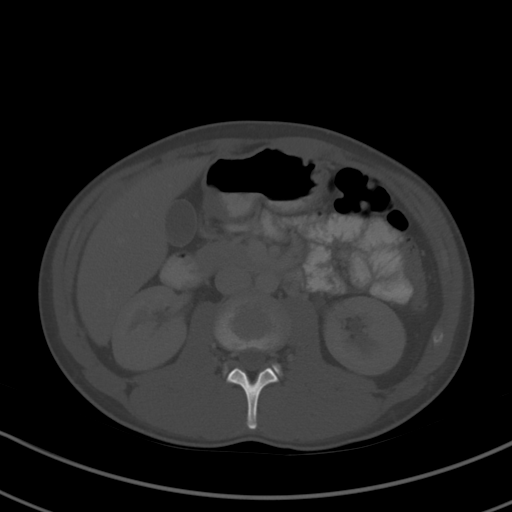
[im 69/90  soft-tissue]
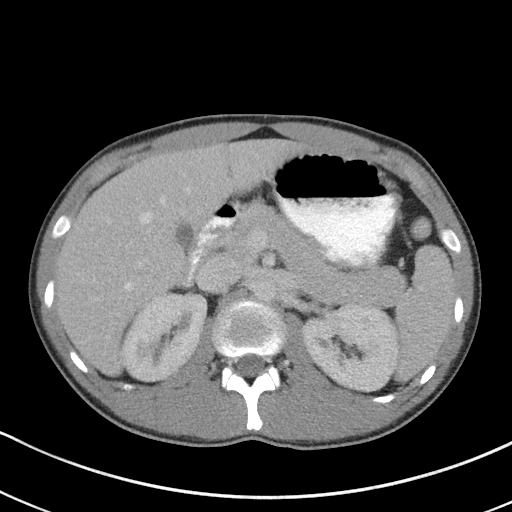
[im 76/90  soft-tissue]
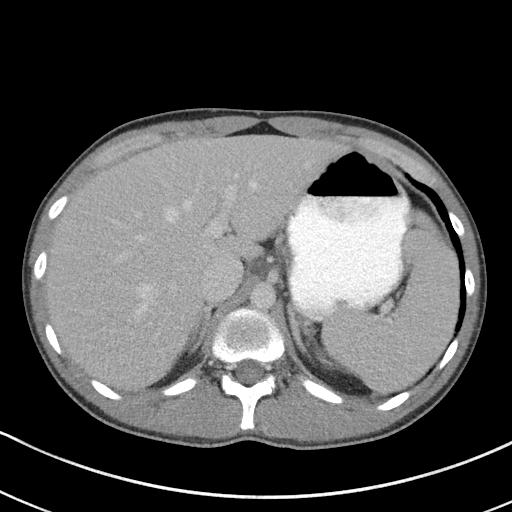
[im 83/90  soft-tissue]
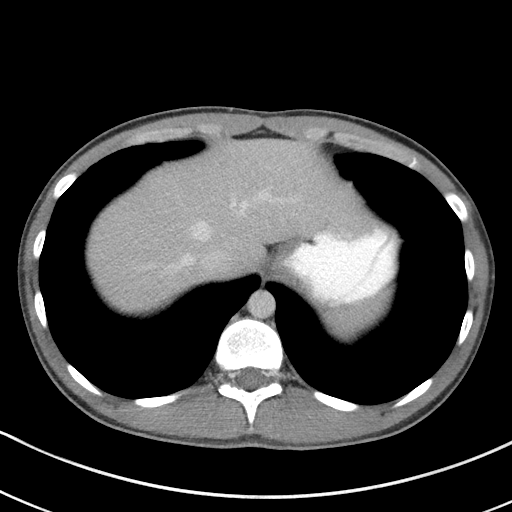

[Series 5: coronal st · coronal · 0.61mm/px · 3 of 69 slices shown]
[im 23/69  soft-tissue]
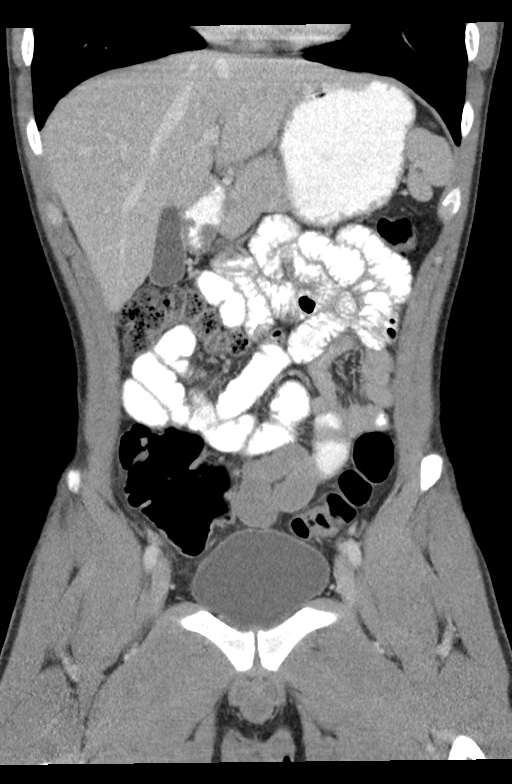
[im 31/69  soft-tissue]
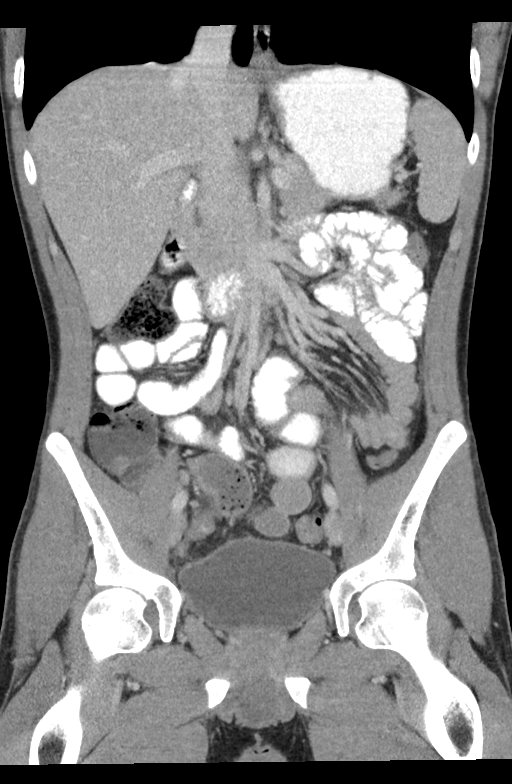
[im 38/69  soft-tissue]
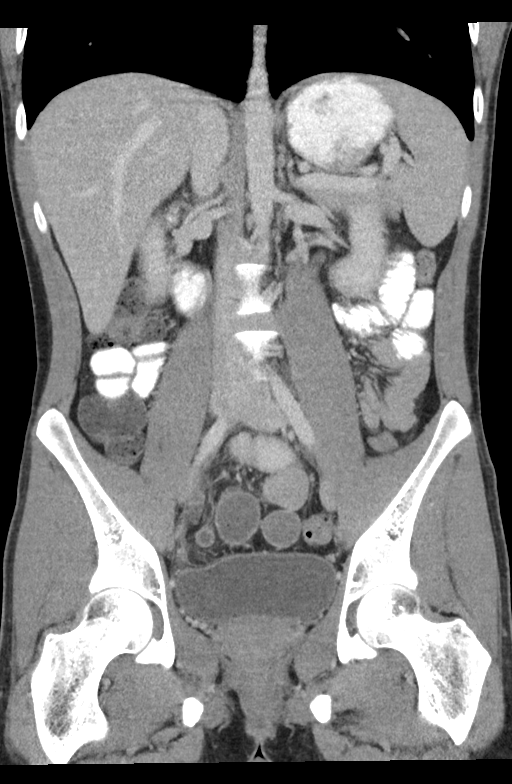

[15 of 46 positions shown; findings below may reference images not displayed]

FINDINGS: Lower chest: The lung bases are clear of acute process. No pleural
effusion or pulmonary lesions. The heart is normal in size. No
pericardial effusion. The distal esophagus and aorta are
unremarkable.

Hepatobiliary: No focal hepatic lesions or intrahepatic biliary
dilatation. The gallbladder is normal. No common bile duct
dilatation.

Pancreas: No mass, inflammation or ductal dilatation.

Spleen: Normal size.  No focal lesions.

Adrenals/Urinary Tract: The adrenal glands and kidneys are
unremarkable. The bladder appears normal.

Stomach/Bowel: The stomach, duodenum and proximal small bowel appear
normal. The distal small bowel is slightly dilated and there is mild
mesenteric edema involving the distal and terminal ileum region. The
colons unremarkable.

The appendix is dilated and fluid-filled and also demonstrates
mucosal enhancement and periappendiceal inflammatory changes. There
is an obstructing 9 mm appendicolith near the orifice. The appendix
is low lying in the pelvis adjacent to the bladder and the terminal
ileum.

Vascular/Lymphatic: The aorta is normal in caliber. No dissection.
The branch vessels are patent. The major venous structures are
patent. No mesenteric or retroperitoneal mass or adenopathy. Small
scattered lymph nodes are noted.

Reproductive: Prostate gland seminal vesicles are unremarkable.

Other: Small amount of free pelvic fluid but I do not see any
findings for perforated appendicitis. No abscess.

Musculoskeletal: No significant bony findings.
IMPRESSION: 1. CT findings consistent with acute appendicitis. Associated
mesenteric edema, probable distal small bowel ileus and small amount
of free pelvic fluid. No findings for perforation or abscess. Low
lying appendix deep in the right pelvis adjacent to the bladder.
2. No other significant findings.
